# Patient Record
Sex: Male | Born: 1980 | Race: White | Hispanic: No | Marital: Single | Smoking: Current every day smoker
Health system: Southern US, Community
[De-identification: ages and names within clinical notes are randomized; demographics above are authoritative.]

## PROBLEM LIST (undated history)

## (undated) DIAGNOSIS — M797 Fibromyalgia: Secondary | ICD-10-CM

## (undated) DIAGNOSIS — M1990 Unspecified osteoarthritis, unspecified site: Secondary | ICD-10-CM

## (undated) HISTORY — DX: Fibromyalgia: M79.7

## (undated) HISTORY — DX: Unspecified osteoarthritis, unspecified site: M19.90

---

## 2012-06-11 ENCOUNTER — Ambulatory Visit (INDEPENDENT_AMBULATORY_CARE_PROVIDER_SITE_OTHER): Payer: BC Managed Care – PPO | Admitting: Family Medicine

## 2012-06-11 VITALS — BP 115/81 | HR 108 | Temp 98.1°F | Resp 16 | Ht 70.0 in | Wt 192.2 lb

## 2012-06-11 DIAGNOSIS — Z720 Tobacco use: Secondary | ICD-10-CM

## 2012-06-11 DIAGNOSIS — K047 Periapical abscess without sinus: Secondary | ICD-10-CM

## 2012-06-11 DIAGNOSIS — F17200 Nicotine dependence, unspecified, uncomplicated: Secondary | ICD-10-CM

## 2012-06-11 MED ORDER — VARENICLINE TARTRATE 1 MG PO TABS: 1.0000 mg | ORAL_TABLET | Freq: Two times a day (BID) | ORAL | Status: DC

## 2012-06-11 MED ORDER — OXYCODONE-ACETAMINOPHEN 5-325 MG PO TABS: 1.0000 | ORAL_TABLET | Freq: Three times a day (TID) | ORAL | Status: DC | PRN

## 2012-06-11 MED ORDER — VARENICLINE TARTRATE 0.5 MG X 11 & 1 MG X 42 PO MISC: ORAL | Status: DC

## 2012-06-11 MED ORDER — PENICILLIN V POTASSIUM 500 MG PO TABS: 500.0000 mg | ORAL_TABLET | Freq: Four times a day (QID) | ORAL | Status: DC

## 2012-06-11 NOTE — Progress Notes (Signed)
  Subjective:    Patient ID: Jerry Murphy, male    DOB: 10/27/1980, 32 y.o.   MRN: [id number]   Chief Complaint  Patient presents with  . Dental Pain    today    HPI Woke from sleep last night suddenly w/ left tooth pain. Started about 3-4 a.m. He had to go to work but having left cheek throbbing pain and headache.  Did take a few tylenol with the tramadol for fibromyalgia.  Does not have a dentist - but plans to.  No bleeding or drainage, some lead test.   PCP: Goes to [facility] in [place] - but is planning to transition care to [place] since he moved here.  Review of Systems    BP 115/81  Pulse 108  Temp(Src) 98.1 F (36.7 C) (Oral)  Resp 16  Ht 5\' 10"  (1.778 m)  Wt 192 lb 3.2 oz (87.181 kg)  BMI 27.58 kg/m2  SpO2 100% Objective:   Physical Exam        Assessment & Plan:  Dental abscess  Tobacco abuse  Meds ordered this encounter  Medications  . traMADol (ULTRAM) 50 MG tablet    Sig: Take 50 mg by mouth 3 (three) times daily.  . tizanidine (ZANAFLEX) 2 MG capsule    Sig: Take 2 mg by mouth 3 (three) times daily.  . penicillin v potassium (VEETID) 500 MG tablet    Sig: Take 1 tablet (500 mg total) by mouth 4 (four) times daily.    Dispense:  56 tablet    Refill:  0  . oxyCODONE-acetaminophen (ROXICET) 5-325 MG per tablet    Sig: Take 1 tablet by mouth every 8 (eight) hours as needed for pain.    Dispense:  20 tablet    Refill:  0  . varenicline (CHANTIX STARTING MONTH PAK) 0.5 MG X 11 & 1 MG X 42 tablet    Sig: Take one 0.5 mg tablet by mouth once daily for 3 days, then increase to one 0.5 mg tablet twice daily for 4 days, then increase to one 1 mg tablet twice daily.    Dispense:  53 tablet    Refill:  0  . varenicline (CHANTIX CONTINUING MONTH PAK) 1 MG tablet    Sig: Take 1 tablet (1 mg total) by mouth 2 (two) times daily.    Dispense:  60 tablet    Refill:  1

## 2012-06-11 NOTE — Patient Instructions (Addendum)
Dr. Kaylyn Layer on [address] is my denstist. He is excellent.  You can also call [id number] and they will find someone who will be a good fit for you.  Smoking Cessation, Tips for Success YOU CAN QUIT SMOKING If you are ready to quit smoking, congratulations! You have chosen to help yourself be healthier. Cigarettes bring nicotine, tar, carbon monoxide, and other irritants into your body. Your lungs, heart, and blood vessels will be able to work better without these poisons. There are many different ways to quit smoking. Nicotine gum, nicotine patches, a nicotine inhaler, or nicotine nasal spray can help with physical craving. Hypnosis, support groups, and medicines help break the habit of smoking. Here are some tips to help you quit for good.  Throw away all cigarettes.  Clean and remove all ashtrays from your home, work, and car.  On a card, write down your reasons for quitting. Carry the card with you and read it when you get the urge to smoke.  Cleanse your body of nicotine. Drink enough water and fluids to keep your urine clear or pale yellow. Do this after quitting to flush the nicotine from your body.  Learn to predict your moods. Do not let a bad situation be your excuse to have a cigarette. Some situations in your life might tempt you into wanting a cigarette.  Never have "just one" cigarette. It leads to wanting another and another. Remind yourself of your decision to quit.  Change habits associated with smoking. If you smoked while driving or when feeling stressed, try other activities to replace smoking. Stand up when drinking your coffee. Brush your teeth after eating. Sit in a different chair when you read the paper. Avoid alcohol while trying to quit, and try to drink fewer caffeinated beverages. Alcohol and caffeine may urge you to smoke.  Avoid foods and drinks that can trigger a desire to smoke, such as sugary or spicy foods and alcohol.  Ask people who smoke not to  smoke around you.  Have something planned to do right after eating or having a cup of coffee. Take a walk or exercise to perk you up. This will help to keep you from overeating.  Try a relaxation exercise to calm you down and decrease your stress. Remember, you may be tense and nervous for the first 2 weeks after you quit, but this will pass.  Find new activities to keep your hands busy. Play with a pen, coin, or rubber band. Doodle or draw things on paper.  Brush your teeth right after eating. This will help cut down on the craving for the taste of tobacco after meals. You can try mouthwash, too.  Use oral substitutes, such as lemon drops, carrots, a cinnamon stick, or chewing gum, in place of cigarettes. Keep them handy so they are available when you have the urge to smoke.  When you have the urge to smoke, try deep breathing.  Designate your home as a nonsmoking area.  If you are a heavy smoker, ask your caregiver about a prescription for nicotine chewing gum. It can ease your withdrawal from nicotine.  Reward yourself. Set aside the cigarette money you save and buy yourself something nice.  Look for support from others. Join a support group or smoking cessation program. Ask someone at home or at work to help you with your plan to quit smoking.  Always ask yourself, "Do I need this cigarette or is this just a reflex?" Tell yourself, "Today, I  choose not to smoke," or "I do not want to smoke." You are reminding yourself of your decision to quit, even if you do smoke a cigarette. HOW WILL I FEEL WHEN I QUIT SMOKING?  The benefits of not smoking start within days of quitting.  You may have symptoms of withdrawal because your body is used to nicotine (the addictive substance in cigarettes). You may crave cigarettes, be irritable, feel very hungry, cough often, get headaches, or have difficulty concentrating.  The withdrawal symptoms are only temporary. They are strongest when you first  quit but will go away within 10 to 14 days.  When withdrawal symptoms occur, stay in control. Think about your reasons for quitting. Remind yourself that these are signs that your body is healing and getting used to being without cigarettes.  Remember that withdrawal symptoms are easier to treat than the major diseases that smoking can cause.  Even after the withdrawal is over, expect periodic urges to smoke. However, these cravings are generally short-lived and will go away whether you smoke or not. Do not smoke!  If you relapse and smoke again, do not lose hope. Most smokers quit 3 times before they are successful.  If you relapse, do not give up! Plan ahead and think about what you will do the next time you get the urge to smoke. LIFE AS A NONSMOKER: MAKE IT FOR A MONTH, MAKE IT FOR LIFE Day 1: Hang this page where you will see it every day. Day 2: Get rid of all ashtrays, matches, and lighters. Day 3: Drink water. Breathe deeply between sips. Day 4: Avoid places with smoke-filled air, such as bars, clubs, or the smoking section of restaurants. Day 5: Keep track of how much money you save by not smoking. Day 6: Avoid boredom. Keep a good book with you or go to the movies. Day 7: Reward yourself! One week without smoking! Day 8: Make a dental appointment to get your teeth cleaned. Day 9: Decide how you will turn down a cigarette before it is offered to you. Day 10: Review your reasons for quitting. Day 11: Distract yourself. Stay active to keep your mind off smoking and to relieve tension. Take a walk, exercise, read a book, do a crossword puzzle, or try a new hobby. Day 12: Exercise. Get off the bus before your stop or use stairs instead of escalators. Day 13: Call on friends for support and encouragement. Day 14: Reward yourself! Two weeks without smoking! Day 15: Practice deep breathing exercises. Day 16: Bet a friend that you can stay a nonsmoker. Day 17: Ask to sit in nonsmoking  sections of restaurants. Day 18: Hang up "No Smoking" signs. Day 19: Think of yourself as a nonsmoker. Day 20: Each morning, tell yourself you will not smoke. Day 21: Reward yourself! Three weeks without smoking! Day 22: Think of smoking in negative ways. Remember how it stains your teeth, gives you bad breath, and leaves you short of breath. Day 23: Eat a nutritious breakfast. Day 24:Do not relive your days as a smoker. Day 25: Hold a pencil in your hand when talking on the telephone. Day 26: Tell all your friends you do not smoke. Day 27: Think about how much better food tastes. Day 28: Remember, one cigarette is one too many. Day 29: Take up a hobby that will keep your hands busy. Day 30: Congratulations! One month without smoking! Give yourself a big reward. Your caregiver can direct you to community resources or  hospitals for support, which may include:  Group support.  Education.  Hypnosis.  Subliminal therapy. Document Released: 10/18/2003 Document Revised: 04/13/2011 Document Reviewed: 11/05/2008 [facility] Patient Information 2013 [place], [place].

## 2012-06-15 ENCOUNTER — Ambulatory Visit (INDEPENDENT_AMBULATORY_CARE_PROVIDER_SITE_OTHER): Payer: BC Managed Care – PPO | Admitting: Family Medicine

## 2012-06-15 VITALS — BP 120/80 | HR 91 | Temp 97.3°F | Resp 16 | Ht 69.0 in | Wt 191.0 lb

## 2012-06-15 DIAGNOSIS — R197 Diarrhea, unspecified: Secondary | ICD-10-CM

## 2012-06-15 DIAGNOSIS — R109 Unspecified abdominal pain: Secondary | ICD-10-CM

## 2012-06-15 DIAGNOSIS — K5289 Other specified noninfective gastroenteritis and colitis: Secondary | ICD-10-CM

## 2012-06-15 DIAGNOSIS — K529 Noninfective gastroenteritis and colitis, unspecified: Secondary | ICD-10-CM

## 2012-06-15 DIAGNOSIS — R112 Nausea with vomiting, unspecified: Secondary | ICD-10-CM

## 2012-06-15 LAB — COMPREHENSIVE METABOLIC PANEL
ALT: 25 U/L (ref 0–53)
AST: 19 U/L (ref 0–37)
Albumin: 5 g/dL (ref 3.5–5.2)
Calcium: 10.3 mg/dL (ref 8.4–10.5)
Chloride: 104 mEq/L (ref 96–112)
Creat: 0.77 mg/dL (ref 0.50–1.35)
Potassium: 4.6 mEq/L (ref 3.5–5.3)
Sodium: 140 mEq/L (ref 135–145)

## 2012-06-15 LAB — POCT CBC
Granulocyte percent: 66.7 %G (ref 37–80)
MID (cbc): 0.5 (ref 0–0.9)
MPV: 8 fL (ref 0–99.8)
POC Granulocyte: 5.4 (ref 2–6.9)
POC MID %: 5.6 %M (ref 0–12)
Platelet Count, POC: 287 10*3/uL (ref 142–424)
RBC: 5.21 M/uL (ref 4.69–6.13)

## 2012-06-15 LAB — POCT UA - MICROSCOPIC ONLY
Bacteria, U Microscopic: NEGATIVE
Casts, Ur, LPF, POC: NEGATIVE
Crystals, Ur, HPF, POC: NEGATIVE
Mucus, UA: NEGATIVE
Yeast, UA: NEGATIVE

## 2012-06-15 LAB — POCT URINALYSIS DIPSTICK
Protein, UA: NEGATIVE
Spec Grav, UA: 1.015
pH, UA: 8

## 2012-06-15 MED ORDER — PROMETHAZINE HCL 25 MG PO TABS: 25.0000 mg | ORAL_TABLET | Freq: Four times a day (QID) | ORAL | Status: DC | PRN

## 2012-06-15 NOTE — Progress Notes (Signed)
Subjective:    Patient ID: QAADIR KENT, male    DOB: October 03, 1980, 32 y.o.   MRN: [id number] Chief Complaint  Patient presents with  . Abdominal Pain    x 1 day   . Nausea    x 1 day   . Emesis    x 1 day  . Diarrhea    x 1 day     HPI  Mr. Pellerito is a pleasant 32 yo male who I saw several days ago for a dental abscess. He did see the dentist last week and he is to continue the full course of the penicillin and then return to the dentist to have his tooth removed after the infection has resolved - his appt is 5/23 for this and he is supposed to cont the pcn till 5/23.  Has been on pcn many times prev and never had any GI side effects or reactions to the med.  He did not need any of the pain medication, percocet, after about 6-8 pills as the antibiotic had kicked in so has not taken any of this in several days. Has not started the chantix yet. About 3d ago, Mr. Bunton developed large amount of liquid diarrhea. Then yesterday, he became nauseated and started vomiting up all food and liquid he got down. Can't keep anything down. Feeling sick, not sleeping at all due to sxs - waking up every 2-3 hrs w/ f/c/sweats. Diffuse muscle and joint aches, no rash.  Urinating normally but brighter color.  Still having diarrhea. Mother w/ some similar illness.  No recent alcohol use but father died of pancreatic cancer and mother also has pancreas problems. But no personal hx.  History reviewed. No pertinent past surgical history.  Past Medical History  Diagnosis Date  . Anxiety   . Arthritis    Current Outpatient Prescriptions on File Prior to Visit  Medication Sig Dispense Refill  . penicillin v potassium (VEETID) 500 MG tablet Take 1 tablet (500 mg total) by mouth 4 (four) times daily.  56 tablet  0  . tizanidine (ZANAFLEX) 2 MG capsule Take 2 mg by mouth 3 (three) times daily.      . traMADol (ULTRAM) 50 MG tablet Take 50 mg by mouth 3 (three) times daily.      . varenicline  (CHANTIX CONTINUING MONTH PAK) 1 MG tablet Take 1 tablet (1 mg total) by mouth 2 (two) times daily.  60 tablet  1  . varenicline (CHANTIX STARTING MONTH PAK) 0.5 MG X 11 & 1 MG X 42 tablet Take one 0.5 mg tablet by mouth once daily for 3 days, then increase to one 0.5 mg tablet twice daily for 4 days, then increase to one 1 mg tablet twice daily.  53 tablet  0   No current facility-administered medications on file prior to visit.   Allergies  Allergen Reactions  . Nsaids Other (See Comments)   Family History  Problem Relation Age of Onset  . Cancer Mother   . Diabetes Mother   . Diabetes Father   . Cancer Father    History   Social History  . Marital Status: Single    Spouse Name: N/A    Number of Children: N/A  . Years of Education: N/A   Social History Main Topics  . Smoking status: Current Every Day Smoker  . Smokeless tobacco: None  . Alcohol Use: No  . Drug Use: No  . Sexually Active: Yes   Other Topics Concern  .  None   Social History Narrative  . None     Review of Systems  Constitutional: Positive for fever, chills, diaphoresis, activity change, appetite change and fatigue. Negative for unexpected weight change.  HENT: Positive for neck pain and neck stiffness.   Respiratory: Negative for shortness of breath.   Cardiovascular: Negative for chest pain.  Gastrointestinal: Positive for nausea, vomiting, abdominal pain and diarrhea. Negative for constipation, blood in stool, abdominal distention, anal bleeding and rectal pain.  Endocrine: Negative for polydipsia, polyphagia and polyuria.  Genitourinary: Positive for hematuria and flank pain. Negative for dysuria, frequency and decreased urine volume.  Musculoskeletal: Positive for myalgias, back pain and arthralgias. Negative for joint swelling and gait problem.  Skin: Negative for rash.  Allergic/Immunologic: Negative for environmental allergies, food allergies and immunocompromised state.  Neurological:  Positive for weakness, light-headedness and headaches. Negative for syncope, facial asymmetry and numbness.  Hematological: Negative for adenopathy. Does not bruise/bleed easily.  Psychiatric/Behavioral: Positive for sleep disturbance.       BP 120/80  Pulse 91  Temp(Src) 97.3 F (36.3 C) (Oral)  Resp 16  Ht 5\' 9"  (1.753 m)  Wt 191 lb (86.637 kg)  BMI 28.19 kg/m2  SpO2 99% Objective:   Physical Exam  Constitutional: He appears well-developed and well-nourished. No distress.  HENT:  Head: Normocephalic and atraumatic.  Neck: Normal range of motion. Neck supple. No thyromegaly present.  Cardiovascular: Normal rate, regular rhythm and normal heart sounds.   Pulmonary/Chest: Effort normal and breath sounds normal.  Abdominal: Soft. Normal appearance. He exhibits no distension, no pulsatile midline mass and no mass. Bowel sounds are decreased. There is no hepatosplenomegaly. There is generalized tenderness. There is rebound and CVA tenderness. There is no rigidity, no guarding, no tenderness at McBurney's point and negative Murphy's sign. No hernia.  Genitourinary: Rectum normal and prostate normal. Rectal exam shows no tenderness and anal tone normal. Guaiac negative stool.  Lymphadenopathy:    He has no cervical adenopathy.  Skin: He is not diaphoretic.      Results for orders placed in visit on 06/15/12  POCT UA - MICROSCOPIC ONLY      Result Value Range   WBC, Ur, HPF, POC neg     RBC, urine, microscopic neg     Bacteria, U Microscopic neg     Mucus, UA neg     Epithelial cells, urine per micros 1-2     Crystals, Ur, HPF, POC neg     Casts, Ur, LPF, POC neg     Yeast, UA neg     Amorphous positive    POCT URINALYSIS DIPSTICK      Result Value Range   Color, UA yellow     Clarity, UA cloudy     Glucose, UA neg     Bilirubin, UA neg     Ketones, UA trace     Spec Grav, UA 1.015     Blood, UA neg     pH, UA 8.0     Protein, UA neg     Urobilinogen, UA 0.2     Nitrite,  UA neg     Leukocytes, UA Negative    POCT CBC      Result Value Range   WBC 8.1  4.6 - 10.2 K/uL   Lymph, poc 2.2  0.6 - 3.4   POC LYMPH PERCENT 27.7  10 - 50 %L   MID (cbc) 0.5  0 - 0.9   POC MID % 5.6  0 -  12 %M   POC Granulocyte 5.4  2 - 6.9   Granulocyte percent 66.7  37 - 80 %G   RBC 5.21  4.69 - 6.13 M/uL   Hemoglobin 16.1  14.1 - 18.1 g/dL   HCT, POC 04.5  40.9 - 53.7 %   MCV 95.9  80 - 97 fL   MCH, POC 30.9  27 - 31.2 pg   MCHC 32.2  31.8 - 35.4 g/dL   RDW, POC 81.1     Platelet Count, POC 287  142 - 424 K/uL   MPV 8.0  0 - 99.8 fL    Assessment & Plan:  Nausea and vomiting  Abdominal pain, unspecified site - Plan: POCT UA - Microscopic Only, POCT urinalysis dipstick, POCT CBC, Comprehensive metabolic panel, Lipase  Diarrhea  Gastroenteritis - suspect viral gastroenteritis - unfortunately, the penicillin may be making this worse but for now w/ trx sxs w/ prn phenergan.  Still has some oxycodone at home from prior visit - ok to use to trx abd pain and may slow down diarrhea as well. Consider prn otc imodium if doesn't. Push fluids - rec water and gatorade/powerade/pedialyte. If abd pain worsens or localizes, or sxs cont for more than 2-3d, RTC for further eval.  Tobacco abuse - do not start chantix until GI sxs completely resolve and the course of penicillin is complete due to potentially overlapping side effects.  Meds ordered this encounter  Medications  . promethazine (PHENERGAN) 25 MG tablet    Sig: Take 1 tablet (25 mg total) by mouth every 6 (six) hours as needed for nausea.    Dispense:  30 tablet    Refill:  0

## 2012-07-20 ENCOUNTER — Ambulatory Visit (INDEPENDENT_AMBULATORY_CARE_PROVIDER_SITE_OTHER): Payer: BC Managed Care – PPO | Admitting: Family Medicine

## 2012-07-20 VITALS — BP 120/82 | HR 100 | Temp 98.6°F | Resp 16 | Ht 69.5 in | Wt 191.0 lb

## 2012-07-20 DIAGNOSIS — K0889 Other specified disorders of teeth and supporting structures: Secondary | ICD-10-CM

## 2012-07-20 DIAGNOSIS — K047 Periapical abscess without sinus: Secondary | ICD-10-CM

## 2012-07-20 DIAGNOSIS — K044 Acute apical periodontitis of pulpal origin: Secondary | ICD-10-CM

## 2012-07-20 DIAGNOSIS — K089 Disorder of teeth and supporting structures, unspecified: Secondary | ICD-10-CM

## 2012-07-20 DIAGNOSIS — K029 Dental caries, unspecified: Secondary | ICD-10-CM

## 2012-07-20 MED ORDER — HYDROCODONE-ACETAMINOPHEN 5-325 MG PO TABS: 1.0000 | ORAL_TABLET | Freq: Four times a day (QID) | ORAL | Status: DC | PRN

## 2012-07-20 MED ORDER — AMOXICILLIN 500 MG PO CAPS: 1000.0000 mg | ORAL_CAPSULE | Freq: Two times a day (BID) | ORAL | Status: DC

## 2012-07-20 NOTE — Patient Instructions (Addendum)
1.  Very important to keep appointment with dentist in two days. 2. Recommend brushing with Sensodyne toothpaste for sensitive teeth.

## 2012-07-20 NOTE — Progress Notes (Signed)
[address]   [place], [place]  [address]   [phone]  Subjective:    Patient ID: Jerry Murphy, male    DOB: January 17, 1981, 32 y.o.   MRN: [id number]  HPI This 33 y.o. male presents for evaluation of dental pain lower R side.  Evaluated by Dr. Clelia Croft on 06/11/12 for dental pain L upper; rx for Penicillin and Oxycodone provided; advised to establish with dentist.   Evaluated by dentist last month for L upper dental issues; resolved L sided issues.  Now having R lower dental pain.  Pain to eat and brush teeth.  Cold drinks make pain worse.  No fever/chills/sweats.  +mild facial swelling.  Has multiple dental caries. Does not have dental coverage.     Review of Systems  Constitutional: Negative for fever, chills, diaphoresis and fatigue.  HENT: Positive for facial swelling and dental problem. Negative for ear pain and sinus pressure.   Skin: Negative for wound.    Past Medical History  Diagnosis Date  . Arthritis   . Fibromyalgia     Rheumatologist/[place].    History reviewed. No pertinent past surgical history.  Prior to Admission medications   Medication Sig Start Date End Date Taking? Authorizing Provider  tizanidine (ZANAFLEX) 2 MG capsule Take 2 mg by mouth 3 (three) times daily.   Yes Historical Provider, MD  traMADol (ULTRAM) 50 MG tablet Take 50 mg by mouth 3 (three) times daily.   Yes Historical Provider, MD  amoxicillin (AMOXIL) 500 MG capsule Take 2 capsules (1,000 mg total) by mouth 2 (two) times daily. 07/20/12   Ethelda Chick, MD  HYDROcodone-acetaminophen (NORCO/VICODIN) 5-325 MG per tablet Take 1 tablet by mouth every 6 (six) hours as needed for pain. 07/20/12   Ethelda Chick, MD  penicillin v potassium (VEETID) 500 MG tablet Take 1 tablet (500 mg total) by mouth 4 (four) times daily. 06/11/12   Sherren Mocha, MD  promethazine (PHENERGAN) 25 MG tablet Take 1 tablet (25 mg total) by mouth every 6 (six) hours as needed for nausea. 06/15/12   Sherren Mocha, MD    varenicline (CHANTIX CONTINUING MONTH PAK) 1 MG tablet Take 1 tablet (1 mg total) by mouth 2 (two) times daily. 06/11/12   Sherren Mocha, MD  varenicline (CHANTIX STARTING MONTH PAK) 0.5 MG X 11 & 1 MG X 42 tablet Take one 0.5 mg tablet by mouth once daily for 3 days, then increase to one 0.5 mg tablet twice daily for 4 days, then increase to one 1 mg tablet twice daily. 06/11/12   Sherren Mocha, MD    Allergies  Allergen Reactions  . Nsaids Other (See Comments)    History   Social History  . Marital Status: Single    Spouse Name: N/A    Number of Children: N/A  . Years of Education: N/A   Occupational History  . Not on file.   Social History Main Topics  . Smoking status: Current Every Day Smoker  . Smokeless tobacco: Not on file  . Alcohol Use: No  . Drug Use: No  . Sexually Active: Yes   Other Topics Concern  . Not on file   Social History Narrative   Marital status:  Single      Children: none      Lives: alone      Employment: owns [employer] in [place].      Tobacco: 1/2 ppd      Alcohol:  None  Drugs: none    Family History  Problem Relation Age of Onset  . Cancer Mother     pancreatic cancer  . Diabetes Mother   . Diabetes Father   . Cancer Father     pancreatic cancer       Objective:   Physical Exam  Nursing note and vitals reviewed. Constitutional: He is oriented to person, place, and time. He appears well-developed and well-nourished. No distress.  HENT:  Head: Normocephalic and atraumatic.  Mouth/Throat: No oral lesions. Dental caries present. No dental abscesses.  +TTP along gums L lower region; mild swelling lower gumline.  No abscess.  Multiple dental caries throughout.  Eyes: Conjunctivae are normal. Pupils are equal, round, and reactive to light.  Neck: Normal range of motion. Neck supple.  Cardiovascular: Normal rate and regular rhythm.   Pulmonary/Chest: Effort normal and breath sounds normal.  Lymphadenopathy:    He has cervical  adenopathy.  Neurological: He is alert and oriented to person, place, and time.  Skin: He is not diaphoretic.  Psychiatric: He has a normal mood and affect. His behavior is normal.       Assessment & Plan:   Pain, dental - Plan: HYDROcodone-acetaminophen (NORCO/VICODIN) 5-325 MG per tablet  Dental infection - Plan: amoxicillin (AMOXIL) 500 MG capsule  Dental caries   1. Dental pain:  New.  Rx for Hydrocodone provided to use when not working or driving.  Follow-up with dentist in 48 hours. 2.  Dental infection R lower:  New.  Rx for Amoxicillin provided.  Keep appointment with dentist in 48 hours. 3.  Multiple dental caries: recommend close follow-up with dentist.  Meds ordered this encounter  Medications  . amoxicillin (AMOXIL) 500 MG capsule    Sig: Take 2 capsules (1,000 mg total) by mouth 2 (two) times daily.    Dispense:  40 capsule    Refill:  0  . HYDROcodone-acetaminophen (NORCO/VICODIN) 5-325 MG per tablet    Sig: Take 1 tablet by mouth every 6 (six) hours as needed for pain.    Dispense:  30 tablet    Refill:  0

## 2012-09-20 ENCOUNTER — Ambulatory Visit (INDEPENDENT_AMBULATORY_CARE_PROVIDER_SITE_OTHER): Payer: BC Managed Care – PPO | Admitting: Family Medicine

## 2012-09-20 VITALS — BP 110/82 | HR 89 | Temp 98.0°F | Resp 18 | Ht 70.0 in | Wt 193.0 lb

## 2012-09-20 DIAGNOSIS — R110 Nausea: Secondary | ICD-10-CM

## 2012-09-20 DIAGNOSIS — M549 Dorsalgia, unspecified: Secondary | ICD-10-CM

## 2012-09-20 MED ORDER — ONDANSETRON 8 MG PO TBDP: 8.0000 mg | ORAL_TABLET | Freq: Three times a day (TID) | ORAL | Status: DC | PRN

## 2012-09-20 NOTE — Patient Instructions (Addendum)
Back Pain, Adult Low back pain is very common. About 1 in 5 people have back pain.The cause of low back pain is rarely dangerous. The pain often gets better over time.About half of people with a sudden onset of back pain feel better in just 2 weeks. About 8 in 10 people feel better by 6 weeks.  CAUSES Some common causes of back pain include:  Strain of the muscles or ligaments supporting the spine.  Wear and tear (degeneration) of the spinal discs.  Arthritis.  Direct injury to the back. DIAGNOSIS Most of the time, the direct cause of low back pain is not known.However, back pain can be treated effectively even when the exact cause of the pain is unknown.Answering your caregiver's questions about your overall health and symptoms is one of the most accurate ways to make sure the cause of your pain is not dangerous. If your caregiver needs more information, he or she may order lab work or imaging tests (X-rays or MRIs).However, even if imaging tests show changes in your back, this usually does not require surgery. HOME CARE INSTRUCTIONS For many people, back pain returns.Since low back pain is rarely dangerous, it is often a condition that people can learn to manageon their own.   Remain active. It is stressful on the back to sit or stand in one place. Do not sit, drive, or stand in one place for more than 30 minutes at a time. Take short walks on level surfaces as soon as pain allows.Try to increase the length of time you walk each day.  Do not stay in bed.Resting more than 1 or 2 days can delay your recovery.  Do not avoid exercise or work.Your body is made to move.It is not dangerous to be active, even though your back may hurt.Your back will likely heal faster if you return to being active before your pain is gone.  Pay attention to your body when you bend and lift. Many people have less discomfortwhen lifting if they bend their knees, keep the load close to their bodies,and  avoid twisting. Often, the most comfortable positions are those that put less stress on your recovering back.  Find a comfortable position to sleep. Use a firm mattress and lie on your side with your knees slightly bent. If you lie on your back, put a pillow under your knees.  Only take over-the-counter or prescription medicines as directed by your caregiver. Over-the-counter medicines to reduce pain and inflammation are often the most helpful.Your caregiver may prescribe muscle relaxant drugs.These medicines help dull your pain so you can more quickly return to your normal activities and healthy exercise.  Put ice on the injured area.  Put ice in a plastic bag.  Place a towel between your skin and the bag.  Leave the ice on for 15-20 minutes, 3-4 times a day for the first 2 to 3 days. After that, ice and heat may be alternated to reduce pain and spasms.  Ask your caregiver about trying back exercises and gentle massage. This may be of some benefit.  Avoid feeling anxious or stressed.Stress increases muscle tension and can worsen back pain.It is important to recognize when you are anxious or stressed and learn ways to manage it.Exercise is a great option. SEEK MEDICAL CARE IF:  You have pain that is not relieved with rest or medicine.  You have pain that does not improve in 1 week.  You have new symptoms.  You are generally not feeling well. SEEK   IMMEDIATE MEDICAL CARE IF:   You have pain that radiates from your back into your legs.  You develop new bowel or bladder control problems.  You have unusual weakness or numbness in your arms or legs.  You develop nausea or vomiting.  You develop abdominal pain.  You feel faint. Document Released: 01/19/2005 Document Revised: 07/21/2011 Document Reviewed: 06/09/2010 [facility] Patient Information 2014 [place], [place]. Nausea and Vomiting Nausea is a sick feeling that often comes before throwing up (vomiting). Vomiting is a reflex  where stomach contents come out of your mouth. Vomiting can cause severe loss of body fluids (dehydration). Children and elderly adults can become dehydrated quickly, especially if they also have diarrhea. Nausea and vomiting are symptoms of a condition or disease. It is important to find the cause of your symptoms. CAUSES   Direct irritation of the stomach lining. This irritation can result from increased acid production (gastroesophageal reflux disease), infection, food poisoning, taking certain medicines (such as nonsteroidal anti-inflammatory drugs), alcohol use, or tobacco use.  Signals from the brain.These signals could be caused by a headache, heat exposure, an inner ear disturbance, increased pressure in the brain from injury, infection, a tumor, or a concussion, pain, emotional stimulus, or metabolic problems.  An obstruction in the gastrointestinal tract (bowel obstruction).  Illnesses such as diabetes, hepatitis, gallbladder problems, appendicitis, kidney problems, cancer, sepsis, atypical symptoms of a heart attack, or eating disorders.  Medical treatments such as chemotherapy and radiation.  Receiving medicine that makes you sleep (general anesthetic) during surgery. DIAGNOSIS Your caregiver may ask for tests to be done if the problems do not improve after a few days. Tests may also be done if symptoms are severe or if the reason for the nausea and vomiting is not clear. Tests may include:  Urine tests.  Blood tests.  Stool tests.  Cultures (to look for evidence of infection).  X-rays or other imaging studies. Test results can help your caregiver make decisions about treatment or the need for additional tests. TREATMENT You need to stay well hydrated. Drink frequently but in small amounts.You may wish to drink water, sports drinks, clear broth, or eat frozen ice pops or gelatin dessert to help stay hydrated.When you eat, eating slowly may help prevent nausea.There are  also some antinausea medicines that may help prevent nausea. HOME CARE INSTRUCTIONS   Take all medicine as directed by your caregiver.  If you do not have an appetite, do not force yourself to eat. However, you must continue to drink fluids.  If you have an appetite, eat a normal diet unless your caregiver tells you differently.  Eat a variety of complex carbohydrates (rice, wheat, potatoes, bread), lean meats, yogurt, fruits, and vegetables.  Avoid high-fat foods because they are more difficult to digest.  Drink enough water and fluids to keep your urine clear or pale yellow.  If you are dehydrated, ask your caregiver for specific rehydration instructions. Signs of dehydration may include:  Severe thirst.  Dry lips and mouth.  Dizziness.  Dark urine.  Decreasing urine frequency and amount.  Confusion.  Rapid breathing or pulse. SEEK IMMEDIATE MEDICAL CARE IF:   You have blood or brown flecks (like coffee grounds) in your vomit.  You have black or bloody stools.  You have a severe headache or stiff neck.  You are confused.  You have severe abdominal pain.  You have chest pain or trouble breathing.  You do not urinate at least once every 8 hours.  You develop  cold or clammy skin.  You continue to vomit for longer than 24 to 48 hours.  You have a fever. MAKE SURE YOU:   Understand these instructions.  Will watch your condition.  Will get help right away if you are not doing well or get worse. Document Released: 01/19/2005 Document Revised: 04/13/2011 Document Reviewed: 06/18/2010 [facility] Patient Information 2014 [place], [place].

## 2012-09-20 NOTE — Progress Notes (Signed)
  Subjective:    Patient ID: Jerry Murphy, male    DOB: [birth year], 31 y.o.   MRN: [id number]  HPI Patient presents with complaint of 2 days of back pain onset after riding roller coaster.  He has not received relief with his PO pain medications (tramadol and zanaflex).  Also complaints of mild nausea from the pain.  No weakness or numbness. No radiation of pain. States he took his last left over hydrocodone with mild relief.   He has history of fibromyalgia and was also seen here for dental pain twice over last 3 months.    Past Medical History  Diagnosis Date  . Arthritis   . Fibromyalgia     Rheumatologist/[place].   History reviewed. No pertinent past surgical history.  Allergies  Allergen Reactions  . Nsaids Other (See Comments)    Review of Systems  Constitutional: Negative for fever, chills and activity change.  HENT: Negative for dental problem.   Gastrointestinal: Positive for nausea and vomiting. Negative for abdominal pain.  Genitourinary: Negative for difficulty urinating.       No incontinence   Musculoskeletal: Positive for back pain. Negative for myalgias and arthralgias.  Neurological: Negative for weakness and numbness.       Objective:   Physical Exam Blood pressure 110/82, pulse 89, temperature 98 F (36.7 C), temperature source Oral, resp. rate 18, height 5\' 10"  (1.778 m), weight 193 lb (87.544 kg), SpO2 99.00%. Body mass index is 27.69 kg/(m^2). Well-developed, well nourished male who is awake, alert and oriented, in NAD. HEENT: [place]/AT, PERRL, EOMI.  OP is clear. Neck: supple, non-tender, no lymphadenopathy, thyromegaly. Heart: RRR, no murmur Lungs: normal effort, CTA Abdomen: normo-active bowel sounds, supple, non-tender, no mass or organomegaly. Extremities: no cyanosis, clubbing or edema. Skin: warm and dry without rash. Psychologic: good mood and appropriate affect, normal speech and behavior. Neuro:  5/5 strength bilateral upper and lower  extremities.  No weakness or numbness.  Negative straight leg raise.  DTRs 2 plus and equal bilaterally. BACK:  Mild paraspinous lumbar pain on exam, no palpable spasm noted.      Assessment & Plan:  Musculoskeletal back pain associated with nausea.  After reviewing records I advised patient that I was not comfortable prescribing narcotic pain medication given his two recent prior visits of pain related complaints and failure to arrange PCP follow up.  He requested phenergan only at that time.  I advised his I would provide a prescription for zofran ODT to take for nausea as needed and that he should continue taking his ultram and zanaflex as needed.  Patient amenable to this situation.

## 2012-09-22 NOTE — Progress Notes (Signed)
Patient discussed with Dr. Lorri Frederick, and CSRS database reviewed. Agree with assessment and plan of care per his note.

## 2013-07-11 ENCOUNTER — Encounter (HOSPITAL_COMMUNITY): Payer: Self-pay | Admitting: Emergency Medicine

## 2013-07-11 ENCOUNTER — Emergency Department (HOSPITAL_COMMUNITY)
Admission: EM | Admit: 2013-07-11 | Discharge: 2013-07-11 | Disposition: A | Discharge status: Home / Self Care | Payer: Self-pay | Attending: Emergency Medicine | Admitting: Emergency Medicine

## 2013-07-11 DIAGNOSIS — IMO0001 Reserved for inherently not codable concepts without codable children: Secondary | ICD-10-CM | POA: Insufficient documentation

## 2013-07-11 DIAGNOSIS — F17200 Nicotine dependence, unspecified, uncomplicated: Secondary | ICD-10-CM | POA: Insufficient documentation

## 2013-07-11 DIAGNOSIS — K047 Periapical abscess without sinus: Secondary | ICD-10-CM | POA: Insufficient documentation

## 2013-07-11 DIAGNOSIS — M129 Arthropathy, unspecified: Secondary | ICD-10-CM | POA: Insufficient documentation

## 2013-07-11 DIAGNOSIS — Z79899 Other long term (current) drug therapy: Secondary | ICD-10-CM | POA: Insufficient documentation

## 2013-07-11 DIAGNOSIS — Z792 Long term (current) use of antibiotics: Secondary | ICD-10-CM | POA: Insufficient documentation

## 2013-07-11 MED ORDER — PENICILLIN V POTASSIUM 500 MG PO TABS: 500.0000 mg | ORAL_TABLET | Freq: Three times a day (TID) | ORAL | Status: DC

## 2013-07-11 MED ORDER — HYDROCODONE-ACETAMINOPHEN 5-325 MG PO TABS: 1.0000 | ORAL_TABLET | ORAL | Status: DC | PRN

## 2013-07-11 NOTE — ED Provider Notes (Signed)
CSN: [id number]     Arrival date & time 07/11/13  1401 History  This chart was scribed for non-physician practitioner, Elpidio Anis, PA-C working with Suzi Roots, MD by Greggory Stallion, ED scribe. This patient was seen in room TR05C/TR05C and the patient's care was started at 3:12 PM.   Chief Complaint  Patient presents with  . Dental Pain   The history is provided by the patient. No language interpreter was used.   HPI Comments: Jerry Murphy is a 33 y.o. male who presents to the Emergency Department complaining of gradual onset left lower dental pain that started earlier this morning. States pain is causing a headache. He thinks he broke his tooth yesterday while he was eating some chicken. Pt has taken tylenol with no relief. Denies fever, facial swelling, trouble swallowing, emesis. Pt smokes cigarettes daily.   Past Medical History  Diagnosis Date  . Arthritis   . Fibromyalgia     Rheumatologist/[place].   History reviewed. No pertinent past surgical history. Family History  Problem Relation Age of Onset  . Cancer Mother     pancreatic cancer  . Diabetes Mother   . Diabetes Father   . Cancer Father     pancreatic cancer   History  Substance Use Topics  . Smoking status: Current Every Day Smoker  . Smokeless tobacco: Not on file  . Alcohol Use: No    Review of Systems  HENT: Positive for dental problem. Negative for trouble swallowing.   Gastrointestinal: Negative for vomiting.  Neurological: Positive for headaches.  All other systems reviewed and are negative.  Allergies  Nsaids  Home Medications   Prior to Admission medications   Medication Sig Start Date End Date Taking? Authorizing Provider  amoxicillin (AMOXIL) 500 MG capsule Take 2 capsules (1,000 mg total) by mouth 2 (two) times daily. 07/20/12   Ethelda Chick, MD  HYDROcodone-acetaminophen (NORCO/VICODIN) 5-325 MG per tablet Take 1 tablet by mouth every 6 (six) hours as needed for pain. 07/20/12    Ethelda Chick, MD  ondansetron (ZOFRAN-ODT) 8 MG disintegrating tablet Take 1 tablet (8 mg total) by mouth every 8 (eight) hours as needed for nausea. 09/20/12   Tarry Kos, MD  penicillin v potassium (VEETID) 500 MG tablet Take 1 tablet (500 mg total) by mouth 4 (four) times daily. 06/11/12   Sherren Mocha, MD  promethazine (PHENERGAN) 25 MG tablet Take 1 tablet (25 mg total) by mouth every 6 (six) hours as needed for nausea. 06/15/12   Sherren Mocha, MD  tizanidine (ZANAFLEX) 2 MG capsule Take 2 mg by mouth 3 (three) times daily.    Historical Provider, MD  traMADol (ULTRAM) 50 MG tablet Take 50 mg by mouth 3 (three) times daily.    Historical Provider, MD  varenicline (CHANTIX CONTINUING MONTH PAK) 1 MG tablet Take 1 tablet (1 mg total) by mouth 2 (two) times daily. 06/11/12   Sherren Mocha, MD  varenicline (CHANTIX STARTING MONTH PAK) 0.5 MG X 11 & 1 MG X 42 tablet Take one 0.5 mg tablet by mouth once daily for 3 days, then increase to one 0.5 mg tablet twice daily for 4 days, then increase to one 1 mg tablet twice daily. 06/11/12   Sherren Mocha, MD   BP 123/70  Pulse 79  Temp(Src) 97.5 F (36.4 C)  Resp 18  Ht 5\' 9"  (1.753 m)  Wt 180 lb (81.647 kg)  BMI 26.57 kg/m2  SpO2 100%  Physical Exam  Nursing note and vitals reviewed. Constitutional: He is oriented to person, place, and time. He appears well-developed and well-nourished. No distress.  HENT:  Head: Normocephalic and atraumatic.  Significant swelling to tooth #17 as well as #16, consistent with abscess. No facial swelling.   Eyes: Conjunctivae and EOM are normal.  Neck: Normal range of motion. No tracheal deviation present.  Cardiovascular: Normal rate.   Pulmonary/Chest: Effort normal. No respiratory distress.  Musculoskeletal: Normal range of motion.  Lymphadenopathy:    He has no cervical adenopathy.  No lymphadenopathy.   Neurological: He is alert and oriented to person, place, and time.  Skin: Skin is warm and dry.   Psychiatric: He has a normal mood and affect. His behavior is normal.    ED Course  Procedures (including critical care time)  DIAGNOSTIC STUDIES: Oxygen Saturation is 100% on RA, normal by my interpretation.    COORDINATION OF CARE: 3:13 PM-Discussed treatment plan which includes penicillin and pain medication with pt at bedside and pt agreed to plan. Will give pt dental referrals and advised him to follow up.   Labs Review Labs Reviewed - No data to display  Imaging Review No results found.   EKG Interpretation None      MDM   Final diagnoses:  None    1. Dental abscess  Uncomplicated dental infection.  I personally performed the services described in this documentation, which was scribed in my presence. The recorded information has been reviewed and is accurate.  Arnoldo HookerShari A Itzael Liptak, PA-C 07/11/13 1532

## 2013-07-11 NOTE — Discharge Instructions (Signed)
Abscessed Tooth  An abscessed tooth is an infection around your tooth. It may be caused by holes or damage to the tooth (cavity) or a dental disease. An abscessed tooth causes mild to very bad pain in and around the tooth. See your dentist right away if you have tooth or gum pain.  HOME CARE   Take your medicine as told. Finish it even if you start to feel better.   Do not drive after taking pain medicine.   Rinse your mouth (gargle) often with salt water ( teaspoon salt in 8 ounces of warm water).   Do not apply heat to the outside of your face.  GET HELP RIGHT AWAY IF:    You have a temperature by mouth above 102 F (38.9 C), not controlled by medicine.   You have chills and a very bad headache.   You have problems breathing or swallowing.   Your mouth will not open.   You develop puffiness (swelling) on the neck or around the eye.   Your pain is not helped by medicine.   Your pain is getting worse instead of better.  MAKE SURE YOU:    Understand these instructions.   Will watch your condition.   Will get help right away if you are not doing well or get worse.  Document Released: 07/08/2007 Document Revised: 04/13/2011 Document Reviewed: 04/29/2010  [employer] Patient Information 2014 [employer].

## 2013-07-11 NOTE — ED Notes (Signed)
Pt reports left lower back molar dental pain since early this am. States he ate some chicken yesterday and may have broken the tooth.

## 2013-07-12 NOTE — ED Provider Notes (Signed)
Medical screening examination/treatment/procedure(s) were performed by non-physician practitioner and as supervising physician I was immediately available for consultation/collaboration.     Suzi Roots, MD 07/12/13 [phone]

## 2013-08-03 ENCOUNTER — Encounter (HOSPITAL_COMMUNITY): Payer: Self-pay | Admitting: Emergency Medicine

## 2013-08-03 ENCOUNTER — Emergency Department (HOSPITAL_COMMUNITY)
Admission: EM | Admit: 2013-08-03 | Discharge: 2013-08-03 | Disposition: A | Discharge status: Home / Self Care | Payer: Self-pay | Attending: Emergency Medicine | Admitting: Emergency Medicine

## 2013-08-03 DIAGNOSIS — K089 Disorder of teeth and supporting structures, unspecified: Secondary | ICD-10-CM | POA: Insufficient documentation

## 2013-08-03 DIAGNOSIS — K0889 Other specified disorders of teeth and supporting structures: Secondary | ICD-10-CM

## 2013-08-03 DIAGNOSIS — F17200 Nicotine dependence, unspecified, uncomplicated: Secondary | ICD-10-CM | POA: Insufficient documentation

## 2013-08-03 DIAGNOSIS — IMO0001 Reserved for inherently not codable concepts without codable children: Secondary | ICD-10-CM | POA: Insufficient documentation

## 2013-08-03 DIAGNOSIS — Z79899 Other long term (current) drug therapy: Secondary | ICD-10-CM | POA: Insufficient documentation

## 2013-08-03 DIAGNOSIS — Z8739 Personal history of other diseases of the musculoskeletal system and connective tissue: Secondary | ICD-10-CM | POA: Insufficient documentation

## 2013-08-03 DIAGNOSIS — R51 Headache: Secondary | ICD-10-CM | POA: Insufficient documentation

## 2013-08-03 DIAGNOSIS — Z792 Long term (current) use of antibiotics: Secondary | ICD-10-CM | POA: Insufficient documentation

## 2013-08-03 MED ORDER — PENICILLIN V POTASSIUM 500 MG PO TABS: 500.0000 mg | ORAL_TABLET | Freq: Four times a day (QID) | ORAL | Status: AC

## 2013-08-03 MED ORDER — HYDROCODONE-ACETAMINOPHEN 5-325 MG PO TABS: 1.0000 | ORAL_TABLET | Freq: Four times a day (QID) | ORAL | Status: DC | PRN

## 2013-08-03 NOTE — Discharge Instructions (Signed)
You have a dental injury. Use the resource guide listed below to help you find a dentist if you do not already have one to followup with. It is very important that you get evaluated by a dentist as soon as possible. Call tomorrow to schedule an appointment. Use your pain medication as prescribed and do not operate heavy machinery while on pain medication. Note that your pain medication contains acetaminophen (Tylenol) & its is not recommended that you use additional acetaminophen (Tylenol) while taking this medication. Take your full course of antibiotics. Read the instructions below. ° °Eat a soft or liquid diet and rinse your mouth out after meals with warm water. You should see a dentist or return here at once if you have increased swelling, increased pain or uncontrolled bleeding from the site of your injury. ° ° °SEEK MEDICAL CARE IF:  °· You have increased pain not controlled with medicines.  °· You have swelling around your tooth, in your face or neck.  °· You have bleeding which starts, continues, or gets worse.  °· You have a fever >101 °· If you are unable to open your mouth ° °RESOURCE GUIDE ° °Dental Problems ° °Patients with Medicaid: °[facility]                     [facility] °[address]                                           [address] °Phone:  [phone]                                                  Phone:  510-2600 ° °If unable to pay or uninsured, contact:  [facility] or [facility] to become qualified for the adult dental clinic. ° °Chronic Pain Problems °Contact [facility]  297-2271 °Patients need to be referred by their primary care doctor. ° °Insufficient Money for Medicine °Contact [employer]:  call "211" or [facility] 271-5999. ° °No Primary Care Doctor °Call [facility]  832-8000 °Other agencies that provide inexpensive medical care °   [facility]  832-8035 °   [facility]  832-7272 °   [facility]  271-5999 °   [facility]  832-4777 °   [facility]  373-0678 °   [facility]  272-1050 ° °Psychological Services °[facility]  832-9600 °[facility]  378-7881 °[facility]   800 853-5163 (emergency services 641-4993) ° °Substance Abuse Resources °[facility]  336-882-2125 °[facility] 336-784-9470 °[facility] 336-285-9073 °[facility] 336-845-3988 °Residential & Outpatient Substance Abuse Program  800-659-3381 ° °Abuse/Neglect °[place] [facility] (336) 641-3795 °[place] [facility] 800-378-5315 (After Hours) ° °Emergency Shelter °[facility] (336) 271-5985 ° °Maternity Homes °[facility] (336) 275-9566 °[facility] (704) 372-4663 ° °MRSA Hotline #:   832-7006 ° ° ° °[place] [facility] ° °[facility]     [employer]                            [facility] °[address] [place]                       [address]      [address]  °[place]                                                [place]                            [place] °Phone:  349-3220                                   Phone:  342-7768                 Phone:  342-8140 ° °[facility] °Phone:  342-8316 ° °[place] [facility] Hotline °(336) 342-1394 °(336) 342-3537 (After Hours) ° ° ° ° °

## 2013-08-03 NOTE — ED Provider Notes (Signed)
CSN: [id number]     Arrival date & time 08/03/13  1437 History  This chart was scribed for Santiago GladHeather Shameika Speelman PA-C working with Gwyneth SproutWhitney Plunkett, MD by Ashley JacobsBrittany Andrews, ED scribe. This patient was seen in room WTR6/WTR6 and the patient's care was started at 3:16 PM.  First MD Initiated Contact with Patient 08/03/13 1452     Chief Complaint  Patient presents with  . Dental Pain     (Consider location/radiation/quality/duration/timing/severity/associated sxs/prior Treatment) The history is provided by the patient and medical records. No language interpreter was used.   HPI Comments: Jerry Murphy is a 33 y.o. male who presents to the Emergency Department complaining of constant, throbbing left lower dental pain since last night. Pt mentions having pain with drinking last night. The pain wakes him out of his sleep. He had a prior dental abscess and mentions this pain feels similar. He has a temperature of 99 in ED today. Denies fever. Denies nausea or vomiting.  No facial swelling.  He has taken OTC pain medications without relief.  He reports that he tried calling his dentist today to set up an appointment, but the dentist office was not open.   [place]     Past Medical History  Diagnosis Date  . Arthritis   . Fibromyalgia     Rheumatologist/[place].   History reviewed. No pertinent past surgical history. Family History  Problem Relation Age of Onset  . Cancer Mother     pancreatic cancer  . Diabetes Mother   . Diabetes Father   . Cancer Father     pancreatic cancer   History  Substance Use Topics  . Smoking status: Current Every Day Smoker  . Smokeless tobacco: Not on file  . Alcohol Use: No    Review of Systems  Constitutional: Negative for fever and chills.  HENT: Positive for dental problem.   Gastrointestinal: Negative for nausea and vomiting.  Neurological: Positive for headaches.  All other systems reviewed and are  negative.     Allergies  Nsaids  Home Medications   Prior to Admission medications   Medication Sig Start Date End Date Taking? Authorizing Provider  amoxicillin (AMOXIL) 500 MG capsule Take 2 capsules (1,000 mg total) by mouth 2 (two) times daily. 07/20/12   Ethelda ChickKristi M Smith, MD  HYDROcodone-acetaminophen (NORCO/VICODIN) 5-325 MG per tablet Take 1 tablet by mouth every 6 (six) hours as needed for pain. 07/20/12   Ethelda ChickKristi M Smith, MD  HYDROcodone-acetaminophen (NORCO/VICODIN) 5-325 MG per tablet Take 1-2 tablets by mouth every 4 (four) hours as needed. 07/11/13   Shari A Upstill, PA-C  ondansetron (ZOFRAN-ODT) 8 MG disintegrating tablet Take 1 tablet (8 mg total) by mouth every 8 (eight) hours as needed for nausea. 09/20/12   Tarry Kosodd M McGrath, MD  penicillin v potassium (VEETID) 500 MG tablet Take 1 tablet (500 mg total) by mouth 4 (four) times daily. 06/11/12   Sherren MochaEva N Shaw, MD  penicillin v potassium (VEETID) 500 MG tablet Take 1 tablet (500 mg total) by mouth 3 (three) times daily. 07/11/13   Shari A Upstill, PA-C  promethazine (PHENERGAN) 25 MG tablet Take 1 tablet (25 mg total) by mouth every 6 (six) hours as needed for nausea. 06/15/12   Sherren MochaEva N Shaw, MD  tizanidine (ZANAFLEX) 2 MG capsule Take 2 mg by mouth 3 (three) times daily.    Historical Provider, MD  traMADol (ULTRAM) 50 MG tablet Take 50 mg by mouth 3 (  three) times daily.    Historical Provider, MD  varenicline (CHANTIX CONTINUING MONTH PAK) 1 MG tablet Take 1 tablet (1 mg total) by mouth 2 (two) times daily. 06/11/12   Sherren MochaEva N Shaw, MD  varenicline (CHANTIX STARTING MONTH PAK) 0.5 MG X 11 & 1 MG X 42 tablet Take one 0.5 mg tablet by mouth once daily for 3 days, then increase to one 0.5 mg tablet twice daily for 4 days, then increase to one 1 mg tablet twice daily. 06/11/12   Sherren MochaEva N Shaw, MD   BP 140/97  Pulse 79  Temp(Src) 99.8 F (37.7 C) (Oral)  Resp 16  SpO2 99% Physical Exam  Nursing note and vitals reviewed. Constitutional: He is  oriented to person, place, and time. He appears well-developed and well-nourished.  HENT:  Head: Normocephalic and atraumatic.  Mouth/Throat: Oropharynx is clear and moist.  No facial swelling.  Tenderness to palpation of the left lower ginviva. No obvious abcess present. No sublingual tenderness or swelling. No submandibular or submental swelling or lymphadenopathy. No trismus. Pt handling secretions well, no drooling.   Eyes: Pupils are equal, round, and reactive to light.  Neck: Normal range of motion.  Full ROM of the neck  Cardiovascular: Normal rate, regular rhythm and normal heart sounds.   Pulmonary/Chest: Effort normal and breath sounds normal.  Lymphadenopathy:    He has no cervical adenopathy.  Neurological: He is alert and oriented to person, place, and time.  Skin: Skin is warm and dry. He is not diaphoretic.    ED Course  Procedures (including critical care time) DIAGNOSTIC STUDIES: Oxygen Saturation is 99% on room air, normal by my interpretation.    COORDINATION OF CARE:  3:19 PM Discussed course of care with pt . Pt understands and agrees.   Labs Review Labs Reviewed - No data to display  Imaging Review No results found.   EKG Interpretation None      MDM   Final diagnoses:  None   Patient with toothache.  No gross abscess.  Exam unconcerning for Ludwig's angina or spread of infection.  Will treat with penicillin and pain medicine.  Urged patient to follow-up with dentist.    I personally performed the services described in this documentation, which was scribed in my presence. The recorded information has been reviewed and is accurate.    Santiago GladHeather Delyla Sandeen, PA-C 08/03/13 1751

## 2013-08-03 NOTE — ED Notes (Signed)
Pt noticed dental pain coming on last night when he drank a soft drink then the dental pain woke him up during the night. Pt states that he has had dental abscess in past.

## 2013-08-04 NOTE — ED Provider Notes (Signed)
Medical screening examination/treatment/procedure(s) were performed by non-physician practitioner and as supervising physician I was immediately available for consultation/collaboration.   EKG Interpretation None        Jerry SproutWhitney Ranald Alessio, MD 08/04/13 [phone]

## 2014-01-23 ENCOUNTER — Emergency Department (HOSPITAL_COMMUNITY): Payer: BC Managed Care – PPO

## 2014-01-23 ENCOUNTER — Emergency Department (HOSPITAL_COMMUNITY)
Admission: EM | Admit: 2014-01-23 | Discharge: 2014-01-23 | Disposition: A | Discharge status: Home / Self Care | Payer: BC Managed Care – PPO | Attending: Emergency Medicine | Admitting: Emergency Medicine

## 2014-01-23 ENCOUNTER — Encounter (HOSPITAL_COMMUNITY): Payer: Self-pay | Admitting: *Deleted

## 2014-01-23 DIAGNOSIS — Z792 Long term (current) use of antibiotics: Secondary | ICD-10-CM | POA: Diagnosis not present

## 2014-01-23 DIAGNOSIS — Z720 Tobacco use: Secondary | ICD-10-CM | POA: Insufficient documentation

## 2014-01-23 DIAGNOSIS — Z79899 Other long term (current) drug therapy: Secondary | ICD-10-CM | POA: Diagnosis not present

## 2014-01-23 DIAGNOSIS — R4182 Altered mental status, unspecified: Secondary | ICD-10-CM | POA: Insufficient documentation

## 2014-01-23 DIAGNOSIS — M797 Fibromyalgia: Secondary | ICD-10-CM | POA: Insufficient documentation

## 2014-01-23 DIAGNOSIS — M1990 Unspecified osteoarthritis, unspecified site: Secondary | ICD-10-CM | POA: Diagnosis not present

## 2014-01-23 DIAGNOSIS — R51 Headache: Secondary | ICD-10-CM | POA: Diagnosis not present

## 2014-01-23 DIAGNOSIS — R519 Headache, unspecified: Secondary | ICD-10-CM

## 2014-01-23 LAB — COMPREHENSIVE METABOLIC PANEL
ALK PHOS: 60 U/L (ref 39–117)
ALT: 18 U/L (ref 0–53)
ANION GAP: 9 (ref 5–15)
AST: 18 U/L (ref 0–37)
Albumin: 4.4 g/dL (ref 3.5–5.2)
BILIRUBIN TOTAL: 0.4 mg/dL (ref 0.3–1.2)
CHLORIDE: 104 meq/L (ref 96–112)
CO2: 27 mmol/L (ref 19–32)
Calcium: 9.1 mg/dL (ref 8.4–10.5)
Creatinine, Ser: 0.85 mg/dL (ref 0.50–1.35)
GFR calc Af Amer: >90 mL/min (ref >90)
GLUCOSE: 102 mg/dL — AB (ref 70–99)
POTASSIUM: 3.7 mmol/L (ref 3.5–5.1)
Sodium: 140 mmol/L (ref 135–145)
Total Protein: 6.7 g/dL (ref 6.0–8.3)

## 2014-01-23 LAB — CBC WITH DIFFERENTIAL/PLATELET
Basophils Absolute: 0.1 10*3/uL (ref 0.0–0.1)
Basophils Relative: 1 % (ref 0–1)
Eosinophils Absolute: 0.3 10*3/uL (ref 0.0–0.7)
Eosinophils Relative: 4 % (ref 0–5)
HEMATOCRIT: 42.3 % (ref 39.0–52.0)
Hemoglobin: 14.5 g/dL (ref 13.0–17.0)
Lymphocytes Relative: 42 % (ref 12–46)
Lymphs Abs: 3.1 10*3/uL (ref 0.7–4.0)
MCH: 30 pg (ref 26.0–34.0)
MCHC: 34.3 g/dL (ref 30.0–36.0)
MCV: 87.6 fL (ref 78.0–100.0)
MONO ABS: 0.6 10*3/uL (ref 0.1–1.0)
MONOS PCT: 8 % (ref 3–12)
NEUTROS ABS: 3.4 10*3/uL (ref 1.7–7.7)
Neutrophils Relative %: 45 % (ref 43–77)
Platelets: 268 10*3/uL (ref 150–400)
RBC: 4.83 MIL/uL (ref 4.22–5.81)
RDW: 12.8 % (ref 11.5–15.5)
WBC: 7.4 10*3/uL (ref 4.0–10.5)

## 2014-01-23 LAB — RAPID URINE DRUG SCREEN, HOSP PERFORMED
Amphetamines: POSITIVE — AB
Barbiturates: NOT DETECTED
Benzodiazepines: POSITIVE — AB
Cocaine: NOT DETECTED
Opiates: NOT DETECTED
Tetrahydrocannabinol: NOT DETECTED

## 2014-01-23 LAB — ETHANOL: ALCOHOL ETHYL (B): 13 mg/dL — AB (ref 0–9)

## 2014-01-23 NOTE — ED Notes (Signed)
The pt reports that a radio frequency is causing batteries to fail  Whenever he is around machinery.  He cannot keep batteries from dying whenever  He is around  . He has had memory loss and his light bill rose from 20.00 to almost 200.00 dollars whenever he ius home.  The battery on his  Mothers iv pump  Stopped whenever he was beside it.  Radio waves are causing all kinds of mishaps for him including his cell phone

## 2014-01-23 NOTE — ED Provider Notes (Signed)
CSN: [id number]     Arrival date & time 01/23/14  0059 History  This chart was scribed for Lyanne CoKevin M Sarafina Puthoff, MD by Karle PlumberJennifer Tensley, ED Scribe. This patient was seen in room D32C/D32C and the patient's care was started at 1:50 AM.  Chief Complaint  Patient presents with  . Headache   Patient is a 33 y.o. male presenting with headaches. The history is provided by the patient. No language interpreter was used.  Headache   HPI Comments:  Jerry Murphy is a 33 y.o. male who presents to the Emergency Department complaining of moderate bilateral temporal region HA that began in the last couple of weeks. Pt states he wants to be tested to see if the radio frequency from his cell phone is causing his HA.  He states is also noted is some bizarre activity in regards to his phone.  He understands that this sounds like delusions.  He is alert and oriented 3.  He has no prior history of psychiatric illness.  Denies substance abuse.  PMH of fibromyalgia and arthritis.  Past Medical History  Diagnosis Date  . Arthritis   . Fibromyalgia     Rheumatologist/[place].   History reviewed. No pertinent past surgical history. Family History  Problem Relation Age of Onset  . Cancer Mother     pancreatic cancer  . Diabetes Mother   . Diabetes Father   . Cancer Father     pancreatic cancer   History  Substance Use Topics  . Smoking status: Current Every Day Smoker  . Smokeless tobacco: Not on file  . Alcohol Use: No    Review of Systems  Neurological: Positive for headaches.  All other systems reviewed and are negative.  A complete 10 system review of systems was obtained and all systems are negative except as noted in the HPI and PMH.   Allergies  Nsaids  Home Medications   Prior to Admission medications   Medication Sig Start Date End Date Taking? Authorizing Provider  amphetamine-dextroamphetamine (ADDERALL) 20 MG tablet Take 20 mg by mouth 3 (three) times daily. 01/12/14  Yes  Historical Provider, MD  cyclobenzaprine (FLEXERIL) 10 MG tablet Take 10 mg by mouth 3 (three) times daily as needed for muscle spasms.  01/02/14  Yes Historical Provider, MD  diazepam (VALIUM) 2 MG tablet Take 2-4 mg by mouth 3 (three) times daily. Take 1 tab in AM, 1 tab in PM, and 2 tabs at night time 12/27/13  Yes Historical Provider, MD  Multiple Vitamin (MULTIVITAMIN WITH MINERALS) TABS tablet Take 1 tablet by mouth daily.   Yes Historical Provider, MD  polyethylene glycol (MIRALAX / GLYCOLAX) packet Take 17 g by mouth daily as needed for mild constipation.   Yes Historical Provider, MD  traMADol (ULTRAM) 50 MG tablet Take 50 mg by mouth 5 (five) times daily.    Yes Historical Provider, MD  amoxicillin (AMOXIL) 500 MG capsule Take 2 capsules (1,000 mg total) by mouth 2 (two) times daily. Patient not taking: Reported on 01/23/2014 07/20/12   Ethelda ChickKristi M Smith, MD  HYDROcodone-acetaminophen (NORCO/VICODIN) 5-325 MG per tablet Take 1 tablet by mouth every 6 (six) hours as needed for pain. Patient not taking: Reported on 01/23/2014 07/20/12   Ethelda ChickKristi M Smith, MD  HYDROcodone-acetaminophen (NORCO/VICODIN) 5-325 MG per tablet Take 1-2 tablets by mouth every 4 (four) hours as needed. Patient not taking: Reported on 01/23/2014 07/11/13   Ocie CornfieldShari A Upstill, PA-C  HYDROcodone-acetaminophen (NORCO/VICODIN) 5-325 MG per tablet Take 1-2 tablets  by mouth every 6 (six) hours as needed. Patient not taking: Reported on 01/23/2014 08/03/13   Santiago GladHeather Laisure, PA-C  ondansetron (ZOFRAN-ODT) 8 MG disintegrating tablet Take 1 tablet (8 mg total) by mouth every 8 (eight) hours as needed for nausea. Patient not taking: Reported on 01/23/2014 09/20/12   Tarry Kosodd M McGrath, MD  penicillin v potassium (VEETID) 500 MG tablet Take 1 tablet (500 mg total) by mouth 4 (four) times daily. Patient not taking: Reported on 01/23/2014 06/11/12   Sherren MochaEva N Shaw, MD  penicillin v potassium (VEETID) 500 MG tablet Take 1 tablet (500 mg total) by mouth 3  (three) times daily. Patient not taking: Reported on 01/23/2014 07/11/13   Melvenia BeamShari A Upstill, PA-C  promethazine (PHENERGAN) 25 MG tablet Take 1 tablet (25 mg total) by mouth every 6 (six) hours as needed for nausea. Patient not taking: Reported on 01/23/2014 06/15/12   Sherren MochaEva N Shaw, MD  tizanidine (ZANAFLEX) 2 MG capsule Take 2 mg by mouth 3 (three) times daily.    Historical Provider, MD  varenicline (CHANTIX CONTINUING MONTH PAK) 1 MG tablet Take 1 tablet (1 mg total) by mouth 2 (two) times daily. Patient not taking: Reported on 01/23/2014 06/11/12   Sherren MochaEva N Shaw, MD  varenicline (CHANTIX STARTING MONTH PAK) 0.5 MG X 11 & 1 MG X 42 tablet Take one 0.5 mg tablet by mouth once daily for 3 days, then increase to one 0.5 mg tablet twice daily for 4 days, then increase to one 1 mg tablet twice daily. Patient not taking: Reported on 01/23/2014 06/11/12   Sherren MochaEva N Shaw, MD   Triage Vitals: BP 151/93 mmHg  Pulse 124  Temp(Src) 97.8 F (36.6 C)  Resp 20  Ht 5\' 9"  (1.753 m)  Wt 188 lb (85.276 kg)  BMI 27.75 kg/m2  SpO2 100% Physical Exam  Constitutional: He is oriented to person, place, and time. He appears well-developed and well-nourished.  HENT:  Head: Normocephalic and atraumatic.  Eyes: EOM are normal. Pupils are equal, round, and reactive to light.  Neck: Normal range of motion.  Cardiovascular: Normal rate, regular rhythm, normal heart sounds and intact distal pulses.   Pulmonary/Chest: Effort normal and breath sounds normal. No respiratory distress.  Abdominal: Soft. He exhibits no distension. There is no tenderness.  Musculoskeletal: Normal range of motion.  Neurological: He is alert and oriented to person, place, and time.  5/5 strength in major muscle groups of  bilateral upper and lower extremities. Speech normal. No facial asymetry.   Skin: Skin is warm and dry.  Psychiatric: He has a normal mood and affect. Judgment normal.  Nursing note and vitals reviewed.   ED Course  Procedures  (including critical care time) DIAGNOSTIC STUDIES: Oxygen Saturation is 100% on RA, normal by my interpretation.   COORDINATION OF CARE: 1:59 AM- Will CT head. Pt verbalizes understanding and agrees to plan.  Medications - No data to display  Labs Review Labs Reviewed  COMPREHENSIVE METABOLIC PANEL - Abnormal; Notable for the following:    Glucose, Bld 102 (*)    BUN <5 (*)    All other components within normal limits  URINE RAPID DRUG SCREEN (HOSP PERFORMED) - Abnormal; Notable for the following:    Benzodiazepines POSITIVE (*)    Amphetamines POSITIVE (*)    All other components within normal limits  ETHANOL - Abnormal; Notable for the following:    Alcohol, Ethyl (B) 13 (*)    All other components within normal limits  CBC WITH  DIFFERENTIAL    Imaging Review Ct Head Wo Contrast  01/23/2014   CLINICAL DATA:  Acute onset of headache and altered mental status. Memory loss. Initial encounter.  EXAM: CT HEAD WITHOUT CONTRAST  TECHNIQUE: Contiguous axial images were obtained from the base of the skull through the vertex without intravenous contrast.  COMPARISON:  None.  FINDINGS: There is no evidence of acute infarction, mass lesion, or intra- or extra-axial hemorrhage on CT.  The posterior fossa, including the cerebellum, brainstem and fourth ventricle, is within normal limits. The third and lateral ventricles, and basal ganglia are unremarkable in appearance. The cerebral hemispheres are symmetric in appearance, with normal gray-white differentiation. No mass effect or midline shift is seen.  There is no evidence of fracture; visualized osseous structures are unremarkable in appearance. The orbits are within normal limits. There is partial opacification of the sphenoid sinus, frontal sinuses and ethmoid air cells. The remaining paranasal sinuses and mastoid air cells are well-aerated. No significant soft tissue abnormalities are seen.  IMPRESSION: 1. No acute intracranial pathology seen on  CT. 2. Partial opacification of the sphenoid sinus, frontal sinuses and ethmoid air cells.   Electronically Signed   By: Roanna Raider M.D.   On: 01/23/2014 02:32  I personally reviewed the imaging tests through PACS system I reviewed available ER/hospitalization records through the EMR    EKG Interpretation None      MDM   Final diagnoses:  Headache  Altered mental status   CT head is normal.  No frontal lobe masses are noted.  The patient to fully has bizarre behavior.  Much of his thoughts seem somewhat delusional.  I had a frank discussion with the patient and he understands this.  He seems to have a surprising amount of insight.  He has no homicidal or suicidal thoughts.  I asked if he would like to talk with the psychiatrist he stated he would not.  I don't think at this time the patient needs acute psychiatric hospitalization.  I have suggested strongly that he discuss this with the psychiatrist and/or psychologist.  He has a primary care physician and states he will follow-up with him this week.  I've asked that the patient return to the ER for any new or worsening symptoms.  He understands that he seems somewhat fixated on this phone.   I personally performed the services described in this documentation, which was scribed in my presence. The recorded information has been reviewed and is accurate.    Lyanne Co, MD 01/24/14 [phone]

## 2014-01-23 NOTE — Discharge Instructions (Signed)
°Emergency Department Resource Guide °1) Find a Doctor and Pay Out of Pocket °Although you won't have to find out who is covered by your insurance plan, it is a good idea to ask around and get recommendations. You will then need to call the office and see if the doctor you have chosen will accept you as a new patient and what types of options they offer for patients who are self-pay. Some doctors offer discounts or will set up payment plans for their patients who do not have insurance, but you will need to ask so you aren't surprised when you get to your appointment. ° °2) Contact Your Local Health Department °Not all health departments have doctors that can see patients for sick visits, but many do, so it is worth a call to see if yours does. If you don't know where your local health department is, you can check in your phone book. The CDC also has a tool to help you locate your state's health department, and many state websites also have listings of all of their local health departments. ° °3) Find a Walk-in Clinic °If your illness is not likely to be very severe or complicated, you may want to try a walk in clinic. These are popping up all over the country in pharmacies, drugstores, and shopping centers. They're usually staffed by nurse practitioners or physician assistants that have been trained to treat common illnesses and complaints. They're usually fairly quick and inexpensive. However, if you have serious medical issues or chronic medical problems, these are probably not your best option. ° °No Primary Care Doctor: °- Call [facility] at  832-8000 - they can help you locate a primary care doctor that  accepts your insurance, provides certain services, etc. °- Physician Referral Service- 1-800-533-3463 ° °Chronic Pain Problems: °Organization         Address  Phone   Notes  °[facility]  (336) 297-2271 Patients need to be referred by their primary care doctor.  ° °Medication  Assistance: °Organization         Address  Phone   Notes  °[place] [facility] [address] °[place], [place] [address] (336) 641-8030 --Must be a resident of [place] °-- Must have NO insurance coverage whatsoever (no Medicaid/ Medicare, etc.) °-- The pt. MUST have a primary care doctor that directs their care regularly and follows them in the community °  °[facility]  (866) 331-1348   °[employer]  (888) 892-1162   ° °Agencies that provide inexpensive medical care: °Organization         Address  Phone   Notes  °[facility]  (336) 832-8035   °[facility]    (336) 832-7272   °[facility] [address] °[place], [place] [address] (336) 832-4777   °[facility] [address], °[place] (336) 271-4999   °[facility]    (336) 373-0678   °[facility]    (336) 272-1050   °[facility] ° [address], [place] Phone:  (336) 832-4444, Fax:  (336) 832-4440 Hours of Operation:  9 am - 6 pm, M-F.  Also accepts Medicaid/Medicare and self-pay.  °[facility] ° [address], [place] Phone: (336) [phone], Fax: (336) 832-3151. Hours of Operation:  8:30 am - 5:30 pm, M-F.  Also accepts Medicaid and self-pay.  °[facility] [place] [address], [place] Phone: (336) 878-6027   °[facility] [address], [place], [place] (336)723-1848, Ext. 123 Mondays & Thursdays: 7-9 AM.  First 15 patients are seen on a first come, first serve basis. °  ° °Medicaid-accepting [place] Providers: ° °Organization         Address  Phone   Notes  °[facility] [address], [place] (336) 641-2100 Also accepts self-pay patients.  °[facility] [address], [place] ° (336) 856-9996   °[facility] [address], [place]  (336) 288-8857   °[facility] [address] [place] [address], [place] (336) 299-7000   °Veita Bland [address], [place]  ° (336) 373-1557 Only accepts [place] Access Medicaid patients after they have their name applied to their card.  ° °Self-Pay (no insurance) in [place]: ° °Organization         Address  Phone   Notes  °Sickle Cell Patients, [facility] [address], [place] (336) 832-1970   °[facility] [address], [place] (336) 832-4400   °[facility] ° [address] [place] [address], [place] (336) 992-4800   °[facility]/Dr. Osei-Bonsu ° [address] [place] [address], [place] or [address], [place] (336) 841-8500 Phone number for both [place] and [place] locations is the same.  °[facility] [address], [place] (336) 299-0000   °[facility] [address] [place] [address], [place] or [address] (336) 852-7530 °(336) 878-2260   °[facility] [address], [place] (336) 350-1642, phone; (336) 294-5005, fax Sees patients 1st and 3rd Saturday of every month.  Must not qualify for public or private insurance (i.e. Medicaid, Medicare, [place] Health Choice, Veterans' Benefits) • Household income should be no more than 200% of the poverty level •The clinic cannot treat you if you are pregnant or think you are pregnant • Sexually transmitted diseases are not treated at the clinic.  ° ° °Dental Care: °Organization         Address  Phone  Notes  °[place] [facility] [address], [place] (336) 641-6152 Accepts children up to age 21 who are enrolled in Medicaid or [place] Health Choice; pregnant women with a Medicaid card; and children who have applied for Medicaid or [place] Health Choice, but were declined, whose parents can pay a reduced fee at time of service.  °[place]  [facility] [place]  [address], [place] (336) 641-7733 Accepts children up to age 21 who are enrolled in Medicaid or [place] Health Choice; pregnant women with a Medicaid card; and children who have applied for Medicaid or [place] Health Choice, but were declined, whose parents can pay a reduced fee at time of service.  °[facility] ° [address], [place] (336) 641-4533 Patients are seen by appointment only. Walk-ins are not accepted. [facility] will see patients 18 years of age and older. °Monday - Tuesday (8am-5pm) °Most Wednesdays (8:30-5pm) °$30 per visit, cash only  °[facility] ° [address], [place] (336) 641-4533 Patients are seen by appointment only. Walk-ins are not accepted. [facility] will see patients 18 years of age and older. °One   Wednesday Evening (Monthly: Volunteer Based).  $30 per visit, cash only  °[facility]  (919) 537-3737 for adults; Children under age 4, call Graduate Pediatric Dentistry at ([phone]) [phone]. Children aged 4-14, please call ([phone]) [phone] to request a pediatric application. ° Dental services are provided in all areas of dental care including fillings, crowns and bridges, complete and partial dentures, implants, gum treatment, root canals, and extractions. Preventive care is also provided. Treatment is provided to both adults and children. °Patients are selected via a lottery and there is often a waiting list. °  °[facility] [address], °[place] ° (336) 763-8833 [email or web address] °  °[facility] [address], [place], [place] (336)723-1848, Ext. 123 Second and Fourth Thursday of each month, opens at 6:30 AM; Clinic ends at 9 AM.  Patients are seen on a first-come first-served basis, and a limited number are seen during each clinic.  ° °[facility] ° [address], [place], [place] (336) 723-7904    Eligibility Requirements °You must have lived in [place], [place], or [place] counties for at least the last three months. °  You cannot be eligible for state or federal sponsored healthcare insurance, including Veterans Administration, Medicaid, or Medicare. °  You generally cannot be eligible for healthcare insurance through your employer.  °  How to apply: °Eligibility screenings are held every Tuesday and Wednesday afternoon from 1:00 pm until 4:00 pm. You do not need an appointment for the interview!  °[facility] [address], [place], [place] 336-631-2330   °[facility]  336-342-8273   °[facility]  336-703-3100   °[facility]  336-570-6415   ° °Behavioral Health Resources in the Community: °Intensive Outpatient Programs °Organization         Address  Phone  Notes  °[facility] [address], [place], [place] 336-878-6098   °[facility] [address], [place], [place] 336-832-9800   °[facility] [address], [place], [place] ° 336-882-2125   °[facility] [address],  °[place], [place] 1-800-853-5163 or 336-641-4981   °Substance Abuse Resources °Organization         Address  Phone  Notes  °[facility]  336-882-2125   °[facility]  336-784-9470   °[facility]  336-285-9073   °[facility]  336-845-3988   °Residential & Outpatient Substance Abuse Program  1-800-659-3381   °Psychological Services °Organization         Address  Phone  Notes  °[facility]  336- 832-9600   °[facility]  336- 378-7881   °[facility] [address], [place] 1-800-853-5163 or 336-641-4981   ° °Mobile Crisis Teams °Organization         Address  Phone  Notes  °[facility], Mobile Crisis Care Unit  1-877-626-1772   °Assertive °[facility] ° [address]  [place], [place] 336-834-9664   °Sharon DeEsch [address] °[place]  336-554-5454   ° °Self-Help/Support Groups °Organization         Address  Phone             Notes  °[facility]  - variety of support groups  336- 373-1402 Call for more information  °Narcotics Anonymous (NA), [facility] [address], °[place]   2 meetings at this location  ° °  Residential Treatment Programs °Organization         Address  Phone  Notes  °[facility] [address],    °[place]  1-866-801-8205   °[facility] ° [address], [place], [place] 704-293-8524   °[facility] [address], [place] 336-845-3988 Admissions: 8am-3pm M-F  °[facility] [address]    °[place], [place] 336-841-1104   °[facility] [address], [place], [place] 336-379-7146   °[facility] [address]  °[place], [place] 336-285-9073   °[facility] Intensive Outpatient [address], [place], [place] 336-852-3033   °[facility] [address]  °[place], [place] 1-877-615-2722 or 336-784-9470   °[facility] [address] [place], [place] 336-227-7417 Accepts Medicaid  °[facility] [address]  °[place] 1-800-659-3381 Substance Abuse/Addiction Treatment  ° °[place] Behavioral Health Resources °Organization         Address  Phone  Notes  °[facility]  (888) 581-9988   °Julie Brannon, PhD [address] [place], [place]   (336) 349-5553 or (336) 951-0000   °[facility]   [address] °[place], [place] (336) 349-4454   °[facility] [address], [place], [place] (336) 342-8316 Insurance/Medicaid/sponsorship through [facility]  °[facility] [address]                                    [place], [place] (336) 342-8316 Therapy/tele-psych/case    °[facility] [address]  ° [place], [place] (336) 349-2233    °Dr. Arfeen  (336) 349-4544   °[facility] [place]  [employer] [place] [facility] 1) [address],  °2) [address], [place] °3)  [address], [place] (336) 349-3220 °(336) 342-7768 ° °(336) 342-8140   °[place] [facility] (336) 342-1394 or (336) 342-3537 (After Hours)    ° ° °

## 2014-01-23 NOTE — ED Notes (Signed)
Pt a/o x 4 with steady gait. 

## 2014-03-22 ENCOUNTER — Encounter (HOSPITAL_COMMUNITY): Payer: Self-pay | Admitting: *Deleted

## 2014-03-22 ENCOUNTER — Emergency Department (HOSPITAL_COMMUNITY)
Admission: EM | Admit: 2014-03-22 | Discharge: 2014-03-22 | Disposition: A | Discharge status: Home / Self Care | Payer: BLUE CROSS/BLUE SHIELD | Attending: Emergency Medicine | Admitting: Emergency Medicine

## 2014-03-22 DIAGNOSIS — T404X5A Adverse effect of other synthetic narcotics, initial encounter: Secondary | ICD-10-CM | POA: Insufficient documentation

## 2014-03-22 DIAGNOSIS — Z720 Tobacco use: Secondary | ICD-10-CM | POA: Insufficient documentation

## 2014-03-22 DIAGNOSIS — M62838 Other muscle spasm: Secondary | ICD-10-CM | POA: Insufficient documentation

## 2014-03-22 DIAGNOSIS — T50905A Adverse effect of unspecified drugs, medicaments and biological substances, initial encounter: Secondary | ICD-10-CM

## 2014-03-22 DIAGNOSIS — F909 Attention-deficit hyperactivity disorder, unspecified type: Secondary | ICD-10-CM | POA: Insufficient documentation

## 2014-03-22 DIAGNOSIS — Z79899 Other long term (current) drug therapy: Secondary | ICD-10-CM | POA: Diagnosis not present

## 2014-03-22 DIAGNOSIS — T481X5A Adverse effect of skeletal muscle relaxants [neuromuscular blocking agents], initial encounter: Secondary | ICD-10-CM | POA: Diagnosis not present

## 2014-03-22 LAB — URINALYSIS, ROUTINE W REFLEX MICROSCOPIC
Bilirubin Urine: NEGATIVE
GLUCOSE, UA: NEGATIVE mg/dL
Hgb urine dipstick: NEGATIVE
KETONES UR: NEGATIVE mg/dL
Leukocytes, UA: NEGATIVE
Nitrite: NEGATIVE
PH: 6.5 (ref 5.0–8.0)
Protein, ur: NEGATIVE mg/dL
Specific Gravity, Urine: 1.002 — ABNORMAL LOW (ref 1.005–1.030)
Urobilinogen, UA: 0.2 mg/dL (ref 0.0–1.0)

## 2014-03-22 LAB — CBC WITH DIFFERENTIAL/PLATELET
BASOS PCT: 0 % (ref 0–1)
Basophils Absolute: 0 10*3/uL (ref 0.0–0.1)
EOS ABS: 0.2 10*3/uL (ref 0.0–0.7)
Eosinophils Relative: 3 % (ref 0–5)
HEMATOCRIT: 44.3 % (ref 39.0–52.0)
HEMOGLOBIN: 15.1 g/dL (ref 13.0–17.0)
Lymphocytes Relative: 38 % (ref 12–46)
Lymphs Abs: 3.3 10*3/uL (ref 0.7–4.0)
MCH: 30.4 pg (ref 26.0–34.0)
MCHC: 34.1 g/dL (ref 30.0–36.0)
MCV: 89.1 fL (ref 78.0–100.0)
MONO ABS: 0.7 10*3/uL (ref 0.1–1.0)
MONOS PCT: 8 % (ref 3–12)
NEUTROS PCT: 51 % (ref 43–77)
Neutro Abs: 4.4 10*3/uL (ref 1.7–7.7)
Platelets: 432 10*3/uL — ABNORMAL HIGH (ref 150–400)
RBC: 4.97 MIL/uL (ref 4.22–5.81)
RDW: 12.9 % (ref 11.5–15.5)
WBC: 8.5 10*3/uL (ref 4.0–10.5)

## 2014-03-22 LAB — COMPREHENSIVE METABOLIC PANEL
ALT: 35 U/L (ref 0–53)
AST: 23 U/L (ref 0–37)
Albumin: 4.8 g/dL (ref 3.5–5.2)
Alkaline Phosphatase: 70 U/L (ref 39–117)
Anion gap: 7 (ref 5–15)
BUN: 8 mg/dL (ref 6–23)
CALCIUM: 9.4 mg/dL (ref 8.4–10.5)
CO2: 29 mmol/L (ref 19–32)
Chloride: 102 mmol/L (ref 96–112)
Creatinine, Ser: 0.83 mg/dL (ref 0.50–1.35)
GFR calc Af Amer: >90 mL/min (ref >90)
GFR calc non Af Amer: >90 mL/min (ref >90)
Glucose, Bld: 100 mg/dL — ABNORMAL HIGH (ref 70–99)
Potassium: 3.5 mmol/L (ref 3.5–5.1)
SODIUM: 138 mmol/L (ref 135–145)
Total Bilirubin: 0.5 mg/dL (ref 0.3–1.2)
Total Protein: 8 g/dL (ref 6.0–8.3)

## 2014-03-22 MED ORDER — ONDANSETRON 8 MG PO TBDP: 8.0000 mg | ORAL_TABLET | Freq: Three times a day (TID) | ORAL | Status: AC | PRN

## 2014-03-22 NOTE — ED Notes (Signed)
Patient states he is on multiple medications, and is afraid that he his having a medication reaction. Patient states he is confused, sweating throughout the day, weak, muscle pain, eyes twitching and diarrhea X2 weeks.

## 2014-03-22 NOTE — Discharge Instructions (Signed)
Your symptoms may be due to an interaction of your Flexeril and tramadol.  It is recommended that you stop these medications. It is recommended to stop taking the Flexeril and decrease your dose of Ultram over the course of a week until you're no longer taking it.  Please follow-up with your prescribing doctors to discuss your medications.  It is also recommended that you find a therapist that you can do talk therapy with.  Return to the emergency department for worsening condition or new concerning symptoms.   Drug Toxicity You are having a reaction to your medicine. This does not mean you are allergic to the drug. Medicines can have many different side effects and toxic reactions. These include:  Stomach symptoms, such as nausea, vomiting, cramps, diarrhea, bloating, constipation, and dry mouth.  Nervous system symptoms, such as weakness, muscle spasms, drowsiness, confusion, agitation, and headache.  Heart and blood vessel symptoms, such as fainting, irregular heartbeat (palpitations), and fast heartbeat.  Skin symptoms, such as itching, light sensitivity, and rashes. When taking more medicines, there is an increased chance of a drug interaction that can make you sick. Take your medicines as your caregiver recommends. Keep a list of the names and doses of each of your drugs. Avoid alcohol and street drugs. Call your caregiver if you are worried about drug side effects or toxicity. Document Released: 01/19/2005 Document Revised: 04/13/2011 Document Reviewed: 07/06/2006 [facility] Patient Information 2015 [place], [place]. This information is not intended to replace advice given to you by your health care provider. Make sure you discuss any questions you have with your health care provider.   Polypharmacy Problems Polypharmacy problems can occur when you take more than one medicine. Your caregivers need to know about all the medicines you take. This includes vitamins, herbs, eyedrops,  over-the-counter medicines, prescription medicines, and creams. Drug interaction problems can include:  Bad reactions or side effects. This can occur when certain drugs are taken together.  Reduced benefit from your medicines. This can occur if one drug reduces the ability of another drug to work. Some drug interaction problems can be life-threatening. Your caregivers can coordinate a plan to help you avoid polypharmacy problems. RISK FACTORS Polypharmacy problems are more likely to occur if:  You have many caregivers prescribing different medicines for you. This is a common problem for elderly patients.  You have a long-term (chronic) medical condition.  You have had an organ transplant.  You have human immunodeficiency virus (HIV).  You are undergoing chemotherapy.  You have hepatitis.  You have kidney disease or kidney failure.  You have significant heart disease or lung disease.  You are elderly.  You are taking medicines that have a low margin of error. This means there is little difference between the right amount of medicine and too much medicine. Medicines in this category include:  Warfarin.  Digoxin.  Lithium.  Theophylline.  Monoamine oxidase (MAO) inhibitors.  Seizure medicines.  Cyclosporine. PREVENTION   Choose one caregiver to be in charge of coordinating your medicines. Inform this caregiver about all medicines and supplements you take, even if they are prescribed by another caregiver.  Purchase all your medicines through the same pharmacy. The pharmacy keeps track of your medicines and possible drug interactions. They can notify your caregiver of potential problems.  Read the information given to you at your pharmacy.  Carry a list of all your medicines and their doses with you. This informs your caregivers, emergency department caregivers, and specialists about the medicines you  are taking. This is especially important before you start a new  medicine.  Use a system to keep track of which medicines you are taking and when. Many patients use a pillbox that separates medicines by the day and time they are supposed to be taken.  Carry a list of your chronic medical problems with you. Conditions such as kidney problems, liver problems, organ transplants, and chronic viral infections affect the way your body handles medicines.  Ask your caregiver or pharmacist if you have any questions about your medicines. SEEK MEDICAL CARE IF:  You have any problems that may be caused by your medicines.  You have chest pain.  You have shortness of breath.  You have an irregular heartbeat.  You have fainting spells.  You have shaking or tremors.  You have weakness or tiredness (lethargy).  You have a rash or swelling in any part of the body.  You have increased bleeding, rectal bleeding, vaginal bleeding, or you bruise easily.  You have abdominal pain.  You have nausea.  You have vomiting. Document Released: 02/27/2004 Document Revised: 04/13/2011 Document Reviewed: 10/08/2010 [facility] Patient Information 2015 [place], [place]. This information is not intended to replace advice given to you by your health care provider. Make sure you discuss any questions you have with your health care provider.

## 2014-03-22 NOTE — ED Notes (Signed)
Pt states that he thinks he is having a chemical reaction to his medications; pt states that he has been doing research and thinks he has a Serotonin imbalance; pt is requesting to be checked for this; pt reports that he get confused during the day; intermittent sweatiness, diarrhea, increased anxiety and eye twitching

## 2014-03-22 NOTE — ED Provider Notes (Signed)
CSN: [id number]     Arrival date & time 03/22/14  0027 History   First MD Initiated Contact with Patient 03/22/14 0118     Chief Complaint  Patient presents with  . Medication Reaction     (Consider location/radiation/quality/duration/timing/severity/associated sxs/prior Treatment) HPI 34 year old male presents to emergency department with complaint of possible medication side effect.  Patient reports for the last 2 weeks he has had increased muscle spasms, anxiety, confusion.  Patient reports he is currently on Adderall for ADHD, Valium, Ultram and Flexeril for fibromyalgia.  He is followed by psychiatrist and primary care doctor.  He reports no recent changes to his medication, no overuse of medication.  Several months ago, Zanaflex was DC'd and he was started on Flexeril.  Patient feels that he may have serotonin syndrome based on research that he is done on the Internet.  Here describes generalized weakness, sweating, eyes twitching, diarrhea.  He reports the symptoms are worse during the day and get better in the evening time.  He denies any illicit substances.  He reports that he has been under more stress recently since the death of his mother.  He denies any SI, HI or hallucinations.  He reports anxiety and depression. Past Medical History  Diagnosis Date  . Arthritis   . Fibromyalgia     Rheumatologist/[place].   History reviewed. No pertinent past surgical history. Family History  Problem Relation Age of Onset  . Cancer Mother     pancreatic cancer  . Diabetes Mother   . Diabetes Father   . Cancer Father     pancreatic cancer   History  Substance Use Topics  . Smoking status: Current Every Day Smoker  . Smokeless tobacco: Not on file  . Alcohol Use: No    Review of Systems   See History of Present Illness; otherwise all other systems are reviewed and negative  Allergies  Nsaids  Home Medications   Prior to Admission medications   Medication Sig Start Date End  Date Taking? Authorizing Provider  amphetamine-dextroamphetamine (ADDERALL) 20 MG tablet Take 20 mg by mouth 3 (three) times daily. 01/12/14  Yes Historical Provider, MD  diazepam (VALIUM) 2 MG tablet Take 2-4 mg by mouth 3 (three) times daily. Take 1 tab in AM, 1 tab in PM, and 2 tabs at night time 12/27/13  Yes Historical Provider, MD  Multiple Vitamin (MULTIVITAMIN WITH MINERALS) TABS tablet Take 1 tablet by mouth daily.   Yes Historical Provider, MD  polyethylene glycol (MIRALAX / GLYCOLAX) packet Take 17 g by mouth daily as needed for mild constipation.   Yes Historical Provider, MD  ondansetron (ZOFRAN-ODT) 8 MG disintegrating tablet Take 1 tablet (8 mg total) by mouth every 8 (eight) hours as needed for nausea. 03/22/14   Olivia Mackielga M Adelard Sanon, MD   BP 127/73 mmHg  Pulse 100  Temp(Src) 98.3 F (36.8 C) (Oral)  Resp 20  SpO2 98% Physical Exam  Constitutional: He is oriented to person, place, and time. He appears well-developed and well-nourished. No distress.  HENT:  Head: Normocephalic and atraumatic.  Nose: Nose normal.  Mouth/Throat: Oropharynx is clear and moist.  Eyes: Conjunctivae and EOM are normal. Pupils are equal, round, and reactive to light.  Neck: Normal range of motion. Neck supple. No JVD present. No tracheal deviation present. No thyromegaly present.  Cardiovascular: Normal rate, regular rhythm, normal heart sounds and intact distal pulses.  Exam reveals no gallop and no friction rub.   No murmur heard. Pulmonary/Chest: Effort normal  and breath sounds normal. No stridor. No respiratory distress. He has no wheezes. He has no rales. He exhibits no tenderness.  Abdominal: Soft. Bowel sounds are normal. He exhibits no distension and no mass. There is no tenderness. There is no rebound and no guarding.  Musculoskeletal: Normal range of motion. He exhibits no edema or tenderness.  Lymphadenopathy:    He has no cervical adenopathy.  Neurological: He is alert and oriented to person,  place, and time. He displays normal reflexes. No cranial nerve deficit. He exhibits normal muscle tone. Coordination normal.  Patient does not have any hyperreflexia on exam, no clonus.  No nystagmus  Skin: Skin is warm and dry. No rash noted. No erythema. No pallor.  Psychiatric: His behavior is normal. Judgment and thought content normal.  Anxious, mild pressured speech  Nursing note and vitals reviewed.   ED Course  Procedures (including critical care time) Labs Review Labs Reviewed  URINALYSIS, ROUTINE W REFLEX MICROSCOPIC - Abnormal; Notable for the following:    Specific Gravity, Urine 1.002 (*)    All other components within normal limits  COMPREHENSIVE METABOLIC PANEL - Abnormal; Notable for the following:    Glucose, Bld 100 (*)    All other components within normal limits  CBC WITH DIFFERENTIAL/PLATELET - Abnormal; Notable for the following:    Platelets 432 (*)    All other components within normal limits    Imaging Review No results found.   EKG Interpretation   Date/Time:  Thursday March 22 2014 01:59:15 EST Ventricular Rate:  101 PR Interval:  123 QRS Duration: 97 QT Interval:  329 QTC Calculation: 426 R Axis:   92 Text Interpretation:  Sinus tachycardia Borderline right axis deviation   early repol pattern No old tracing to compare Confirmed by Aryka Coonradt  MD, Vitaliy Eisenhour  ([id number]) on 03/22/2014 2:35:17 AM      MDM   Final diagnoses:  Medication reaction, initial encounter   34 year old male with multiple symptoms that may be secondary to use of both Flexeril and Ultram at the same time.  I do not feel that he has true serotonin syndrome, he is not hyper pyrexia, no tachycardia, no hypertension.  No hyperreflexia.  He does not have an altered mental status.  Patient may have some increase in his serotonin due to the medications.  I have recommended that he stop taking the Flexeril and taper off his Ultram.  He is also instructed to follow-up with his primary care  doctor, as well as psychiatrist.  Loney Laurence today is unremarkable.    Olivia Mackie, MD 03/22/14 [phone]

## 2014-03-22 NOTE — ED Notes (Signed)
Patient verbalizes understanding of discharge instructions, prescription medications, home care ad follow up care. Patient ambulatory out of department at this time.

## 2014-05-17 ENCOUNTER — Encounter (HOSPITAL_COMMUNITY): Payer: Self-pay | Admitting: *Deleted

## 2014-05-17 ENCOUNTER — Emergency Department (HOSPITAL_COMMUNITY)
Admission: EM | Admit: 2014-05-17 | Discharge: 2014-05-17 | Discharge status: Against Medical Advice | Payer: BLUE CROSS/BLUE SHIELD | Attending: Emergency Medicine | Admitting: Emergency Medicine

## 2014-05-17 DIAGNOSIS — Z720 Tobacco use: Secondary | ICD-10-CM | POA: Insufficient documentation

## 2014-05-17 DIAGNOSIS — R110 Nausea: Secondary | ICD-10-CM | POA: Diagnosis not present

## 2014-05-17 DIAGNOSIS — R52 Pain, unspecified: Secondary | ICD-10-CM | POA: Diagnosis not present

## 2014-05-17 DIAGNOSIS — R05 Cough: Secondary | ICD-10-CM | POA: Insufficient documentation

## 2014-05-17 DIAGNOSIS — R197 Diarrhea, unspecified: Secondary | ICD-10-CM | POA: Insufficient documentation

## 2014-05-17 DIAGNOSIS — R0981 Nasal congestion: Secondary | ICD-10-CM | POA: Diagnosis not present

## 2014-05-17 DIAGNOSIS — R509 Fever, unspecified: Secondary | ICD-10-CM | POA: Insufficient documentation

## 2014-05-17 NOTE — ED Notes (Signed)
Pt states "I just realized that this is the same place that got my name mixed up last time, I would rather just go see my doctor in the morning"; pt declined to sign AMA form

## 2014-05-17 NOTE — ED Notes (Signed)
Pt states that he has not felt well in 4 days; pt c/o nausea, diarrhea, generalized body aches, congestion and cough; pt states that he had vomiting the first day but none since; pt c/o diarrhea x 10 times today; pt states that he has a cough, non-productive; pt reports fever at home; afebrile in the ER

## 2014-10-01 ENCOUNTER — Encounter: Payer: Self-pay | Admitting: Emergency Medicine

## 2014-10-01 ENCOUNTER — Emergency Department
Admission: EM | Admit: 2014-10-01 | Discharge: 2014-10-01 | Disposition: A | Discharge status: Home / Self Care | Payer: BLUE CROSS/BLUE SHIELD | Attending: Emergency Medicine | Admitting: Emergency Medicine

## 2014-10-01 DIAGNOSIS — Z79899 Other long term (current) drug therapy: Secondary | ICD-10-CM | POA: Insufficient documentation

## 2014-10-01 DIAGNOSIS — K029 Dental caries, unspecified: Secondary | ICD-10-CM | POA: Insufficient documentation

## 2014-10-01 DIAGNOSIS — Z720 Tobacco use: Secondary | ICD-10-CM | POA: Insufficient documentation

## 2014-10-01 DIAGNOSIS — K047 Periapical abscess without sinus: Secondary | ICD-10-CM

## 2014-10-01 MED ORDER — HYDROCODONE-ACETAMINOPHEN 5-325 MG PO TABS: 1.0000 | ORAL_TABLET | ORAL | Status: AC | PRN

## 2014-10-01 MED ORDER — AMOXICILLIN 500 MG PO TABS
500.0000 mg | ORAL_TABLET | Freq: Two times a day (BID) | ORAL | Status: DC
Start: 2014-10-01 — End: 2015-10-14

## 2014-10-01 MED ORDER — LIDOCAINE VISCOUS 2 % MT SOLN: 20.0000 mL | OROMUCOSAL | Status: AC | PRN

## 2014-10-01 NOTE — Discharge Instructions (Signed)
Dental Pain °A tooth ache may be caused by cavities (tooth decay). Cavities expose the nerve of the tooth to air and hot or cold temperatures. It may come from an infection or abscess (also called a boil or furuncle) around your tooth. It is also often caused by dental caries (tooth decay). This causes the pain you are having. °DIAGNOSIS  °Your caregiver can diagnose this problem by exam. °TREATMENT  °· If caused by an infection, it may be treated with medications which kill germs (antibiotics) and pain medications as prescribed by your caregiver. Take medications as directed. °· Only take over-the-counter or prescription medicines for pain, discomfort, or fever as directed by your caregiver. °· Whether the tooth ache today is caused by infection or dental disease, you should see your dentist as soon as possible for further care. °SEEK MEDICAL CARE IF: °The exam and treatment you received today has been provided on an emergency basis only. This is not a substitute for complete medical or dental care. If your problem worsens or new problems (symptoms) appear, and you are unable to meet with your dentist, call or return to this location. °SEEK IMMEDIATE MEDICAL CARE IF:  °· You have a fever. °· You develop redness and swelling of your face, jaw, or neck. °· You are unable to open your mouth. °· You have severe pain uncontrolled by pain medicine. °MAKE SURE YOU:  °· Understand these instructions. °· Will watch your condition. °· Will get help right away if you are not doing well or get worse. °Document Released: 01/19/2005 Document Revised: 04/13/2011 Document Reviewed: 09/07/2007 °ExitCare® Patient Information ©2015 [employer]. This information is not intended to replace advice given to you by your health care provider. Make sure you discuss any questions you have with your health care provider. ° °Dental Caries °Dental caries (also called tooth decay) is the most common oral disease. It can occur at any age but is  more common in children and young adults.  °HOW DENTAL CARIES DEVELOPS  °The process of decay begins when bacteria and foods (particularly sugars and starches) combine in your mouth to produce plaque. Plaque is a substance that sticks to the hard, outer surface of a tooth (enamel). The bacteria in plaque produce acids that attack enamel. These acids may also attack the root surface of a tooth (cementum) if it is exposed. Repeated attacks dissolve these surfaces and create holes in the tooth (cavities). If left untreated, the acids destroy the other layers of the tooth.  °RISK FACTORS °· Frequent sipping of sugary beverages.   °· Frequent snacking on sugary and starchy foods, especially those that easily get stuck in the teeth.   °· Poor oral hygiene.   °· Dry mouth.   °· Substance abuse such as methamphetamine abuse.   °· Broken or poor-fitting dental restorations.   °· Eating disorders.   °· Gastroesophageal reflux disease (GERD).   °· Certain radiation treatments to the head and neck. °SYMPTOMS °In the early stages of dental caries, symptoms are seldom present. Sometimes white, chalky areas may be seen on the enamel or other tooth layers. In later stages, symptoms may include: °· Pits and holes on the enamel. °· Toothache after sweet, hot, or cold foods or drinks are consumed. °· Pain around the tooth. °· Swelling around the tooth. °DIAGNOSIS  °Most of the time, dental caries is detected during a regular dental checkup. A diagnosis is made after a thorough medical and dental history is taken and the surfaces of your teeth are checked for signs of   dental caries. Sometimes special instruments, such as lasers, are used to check for dental caries. Dental X-ray exams may be taken so that areas not visible to the eye (such as between the contact areas of the teeth) can be checked for cavities.  °TREATMENT  °If dental caries is in its early stages, it may be reversed with a fluoride treatment or an application of a  remineralizing agent at the dental office. Thorough brushing and flossing at home is needed to aid these treatments. If it is in its later stages, treatment depends on the location and extent of tooth destruction:  °· If a small area of the tooth has been destroyed, the destroyed area will be removed and cavities will be filled with a material such as gold, silver amalgam, or composite resin.   °· If a large area of the tooth has been destroyed, the destroyed area will be removed and a cap (crown) will be fitted over the remaining tooth structure.   °· If the center part of the tooth (pulp) is affected, a procedure called a root canal will be needed before a filling or crown can be placed.   °· If most of the tooth has been destroyed, the tooth may need to be pulled (extracted). °HOME CARE INSTRUCTIONS °You can prevent, stop, or reverse dental caries at home by practicing good oral hygiene. Good oral hygiene includes: °· Thoroughly cleaning your teeth at least twice a day with a toothbrush and dental floss.   °· Using a fluoride toothpaste. A fluoride mouth rinse may also be used if recommended by your dentist or health care provider.   °· Restricting the amount of sugary and starchy foods and sugary liquids you consume.   °· Avoiding frequent snacking on these foods and sipping of these liquids.   °· Keeping regular visits with a dentist for checkups and cleanings. °PREVENTION  °· Practice good oral hygiene. °· Consider a dental sealant. A dental sealant is a coating material that is applied by your dentist to the pits and grooves of teeth. The sealant prevents food from being trapped in them. It may protect the teeth for several years. °· Ask about fluoride supplements if you live in a community without fluorinated water or with water that has a low fluoride content. Use fluoride supplements as directed by your dentist or health care provider. °· Allow fluoride varnish applications to teeth if directed by your  dentist or health care provider. °Document Released: 10/11/2001 Document Revised: 06/05/2013 Document Reviewed: 01/22/2012 °ExitCare® Patient Information ©2015 [employer]. This information is not intended to replace advice given to you by your health care provider. Make sure you discuss any questions you have with your health care provider. ° °OPTIONS FOR DENTAL FOLLOW UP CARE ° °[place] [facility] [employer] - Local Safety Net Dental Clinics °http://www.ncdhhs.gov/dph/oralhealth/services/safetynetclinics.htm °  °[facility] (336-562-3123) ° °[facility] (919-933-9087) ° °[facility] (919-663-1744 ext 237) ° ° [facility] (336-570-6415) ° °[facility] (919-968-2025) °This clinic caters to the indigent population and is on a lottery system. °Location: °[facility], [address], [place] °Clinic Hours: °Wednesdays from 6pm - 9pm, patients seen by a lottery system. °For dates, call or go to www.med.[facility].edu/[facility]/patients/Dental-[facility] °Services: °Cleanings, fillings and simple extractions. °Payment Options: °DENTAL WORK IS FREE OF CHARGE. Bring proof of income or support. °Best way to get seen: °Arrive at 5:15 pm - this is a lottery, NOT first come/first serve, so arriving earlier will not increase   your chances of being seen. °  °  °[facility] °919-537-3737 °Select option 1 for emergencies °  °Location: °[facility], [address], [place] °Clinic Hours: °No walk-ins accepted - call the day before to schedule an appointment. °Check in times are 9:30 am and 1:30 pm. °Services: °Simple extractions, temporary fillings, pulpectomy/pulp debridement, uncomplicated abscess drainage. °Payment Options: °PAYMENT IS DUE AT THE TIME OF SERVICE.  Fee is usually $100-200, additional surgical procedures (e.g. abscess drainage) may be extra. °Cash, checks, Visa/MasterCard  accepted.  Can file Medicaid if patient is covered for dental - patient should call case worker to check. °No discount for [facility] patients. °Best way to get seen: °MUST call the day before and get onto the schedule. Can usually be seen the next 1-2 days. No walk-ins accepted. °  °  °[facility] °919-933-9087 °  °Location: °[facility], [address], [place] °Clinic Hours: °M, W, Th, F 8am or 1:30pm, Tues 9a or 1:30 - first come/first served. °Services: °Simple extractions, temporary fillings, uncomplicated abscess drainage.  You do not need to be an [place] resident. °Payment Options: °PAYMENT IS DUE AT THE TIME OF SERVICE. °Dental insurance, otherwise sliding scale - bring proof of income or support. °Depending on income and treatment needed, cost is usually $50-200. °Best way to get seen: °Arrive early as it is first come/first served. °  °  °[facility] °919-542-1641 °  °Location: °[address] °Clinic Hours: °Mon-Thu 8a-5p °Services: °Most basic dental services including extractions and fillings. °Payment Options: °PAYMENT IS DUE AT THE TIME OF SERVICE. °Sliding scale, up to 50% off - bring proof if income or support. °Medicaid with dental option accepted. °Best way to get seen: °Call to schedule an appointment, can usually be seen within 2 weeks OR they will try to see walk-ins - show up at 8a or 2p (you may have to wait). °  °  °[facility] °919-245-2435 °[place] RESIDENTS ONLY °  °Location: °[facility], [address], [place], [place] [address] °Clinic Hours: By appointment only. °Monday - Thursday 8am-5pm, Friday 8am-12pm °Services: Cleanings, fillings, extractions. °Payment Options: °PAYMENT IS DUE AT THE TIME OF SERVICE. °Cash, Visa or MasterCard. Sliding scale - $30 minimum per service. °Best way to get seen: °Come in to office, complete packet and make an  appointment - need proof of income °or support monies for each household member and proof of [place] residence. °Usually takes about a month to get in. °  °  °[facility] °919-956-4038 °  °Location: °[address] [place] °Clinic Hours: Walk-in Urgent Care Dental Services are offered Monday-Friday mornings only. °The numbers of emergencies accepted daily is limited to the number of °providers available. °Maximum 15 - Mondays, Wednesdays & Thursdays °Maximum 10 - Tuesdays & Fridays °Services: °You do not need to be a [place] resident to be seen for a dental emergency. °Emergencies are defined as pain, swelling, abnormal bleeding, or dental trauma. Walkins will receive x-rays if needed. °NOTE: Dental cleaning is not an emergency. °Payment Options: °PAYMENT IS DUE AT THE TIME OF SERVICE. °Minimum co-pay is $40.00 for uninsured patients. °Minimum co-pay is $3.00 for Medicaid with dental coverage. °Dental Insurance is accepted and must be presented at time of visit. °Medicare does not cover dental. °Forms of payment: Cash, credit card, checks. °Best way to get seen: °If   not previously registered with the clinic, walk-in dental registration begins at 7:15 am and is on a first come/first serve basis. °If previously registered with the clinic, call to make an appointment. °  °  °[facility] °919-776-4359 °[place] RESIDENTS ONLY °  °Location: °[address], [place], [place] °Clinic Hours: °Mon-Thu 10a-2p °Services: Extractions only! °Payment Options: °FREE (donations accepted) - bring proof of income or support °Best way to get seen: °Call and schedule an appointment OR come at 8am on the 1st Monday of every month (except for holidays) when it is first come/first served. °  °  °[facility] °[phone] °  °Location: °[address], [place] °Clinic Hours: °Friday mornings °Services, Payment Options, Best way to get seen: °Call for info °

## 2014-10-01 NOTE — ED Notes (Signed)
Tooth pain since last pm

## 2014-10-01 NOTE — ED Provider Notes (Signed)
[facility] Emergency Department Provider Note  ____________________________________________  Time seen: Approximately 3:56 PM  I have reviewed the triage vital signs and the nursing notes.   HISTORY  Chief Complaint Dental Pain    HPI Jerry Murphy is a 34 y.o. male who presents with dental pain since last night. Patient states that he's had dental pains on and off for a while and will be going to school dentistry to get his tooth removed. Present rates pain as 10 over 10. Last time this turned into an abscess per his history.   Past Medical History  Diagnosis Date  . Arthritis   . Fibromyalgia     Rheumatologist/[place].    There are no active problems to display for this patient.   History reviewed. No pertinent past surgical history.  Current Outpatient Rx  Name  Route  Sig  Dispense  Refill  . amoxicillin (AMOXIL) 500 MG tablet   Oral   Take 1 tablet (500 mg total) by mouth 2 (two) times daily.   20 tablet   0   . amphetamine-dextroamphetamine (ADDERALL) 20 MG tablet   Oral   Take 20 mg by mouth 3 (three) times daily.      0   . diazepam (VALIUM) 2 MG tablet   Oral   Take 2-4 mg by mouth 3 (three) times daily. Take 1 tab in AM, 1 tab in PM, and 2 tabs at night time         . HYDROcodone-acetaminophen (NORCO) 5-325 MG per tablet   Oral   Take 1-2 tablets by mouth every 4 (four) hours as needed for moderate pain.   8 tablet   0   . lidocaine (XYLOCAINE) 2 % solution   Mouth/Throat   Use as directed 20 mLs in the mouth or throat as needed for mouth pain.   100 mL   0   . Multiple Vitamin (MULTIVITAMIN WITH MINERALS) TABS tablet   Oral   Take 1 tablet by mouth daily.         . ondansetron (ZOFRAN-ODT) 8 MG disintegrating tablet   Oral   Take 1 tablet (8 mg total) by mouth every 8 (eight) hours as needed for nausea.   10 tablet   0   . polyethylene glycol (MIRALAX / GLYCOLAX) packet   Oral   Take 17 g by  mouth daily as needed for mild constipation.           Allergies Nsaids  Family History  Problem Relation Age of Onset  . Cancer Mother     pancreatic cancer  . Diabetes Mother   . Diabetes Father   . Cancer Father     pancreatic cancer    Social History Social History  Substance Use Topics  . Smoking status: Current Every Day Smoker  . Smokeless tobacco: None  . Alcohol Use: No    Review of Systems Constitutional: No fever/chills Eyes: No visual changes. ENT: No sore throat. Positive dental caries and pain. Cardiovascular: Denies chest pain. Respiratory: Denies shortness of breath. Gastrointestinal: No abdominal pain.  No nausea, no vomiting.  No diarrhea.  No constipation. Genitourinary: Negative for dysuria. Musculoskeletal: Negative for back pain. Skin: Negative for rash. Neurological: Negative for headaches, focal weakness or numbness.  10-point ROS otherwise negative.  ____________________________________________   PHYSICAL EXAM:  VITAL SIGNS: ED Triage Vitals  Enc Vitals Group     BP 10/01/14 1535 138/91 mmHg     Pulse Rate 10/01/14  1535 106     Resp 10/01/14 1535 18     Temp 10/01/14 1535 97.9 F (36.6 C)     Temp Source 10/01/14 1535 Oral     SpO2 10/01/14 1535 96 %     Weight 10/01/14 1535 185 lb (83.915 kg)     Height 10/01/14 1535 5\' 9"  (1.753 m)     Head Cir --      Peak Flow --      Pain Score 10/01/14 1538 8     Pain Loc --      Pain Edu? --      Excl. in GC? --     Constitutional: Alert and oriented. Well appearing and in no acute distress. Mouth/Throat: Mucous membranes are moist.  Oropharynx non-erythematous. Positive dental caries right upper left lower multiple Neck: No stridor.   Cardiovascular: Normal rate, regular rhythm. Grossly normal heart sounds.  Good peripheral circulation. Respiratory: Normal respiratory effort.  No retractions. Lungs CTAB. Neurologic:  Normal speech and language. No gross focal neurologic deficits  are appreciated. No gait instability. Skin:  Skin is warm, dry and intact. No rash noted. Psychiatric: Mood and affect are normal. Speech and behavior are normal.  ____________________________________________   LABS (all labs ordered are listed, but only abnormal results are displayed)  Labs Reviewed - No data to display ____________________________________________   PROCEDURES  Procedure(s) performed: None  Critical Care performed: No  ____________________________________________   INITIAL IMPRESSION / ASSESSMENT AND PLAN / ED COURSE  Pertinent labs & imaging results that were available during my care of the patient were reviewed by me and considered in my medical decision making (see chart for details).  Dental caries/fractures. Rx given for amoxicillin 500 mg 3 times a day #30 Motrin 800 mg 3 times a day and viscous lidocaine as needed for pain.  Patient voices no other emergency medical complaints at this time. He states he will follow-up with the his local dentist as soon as possible ____________________________________________   FINAL CLINICAL IMPRESSION(S) / ED DIAGNOSES  Final diagnoses:  Infected dental carries      Evangeline Dakin, PA-C 10/01/14 1622  Myrna Blazer, MD 10/02/14 [phone]

## 2015-05-01 ENCOUNTER — Emergency Department: Payer: 59

## 2015-05-01 ENCOUNTER — Emergency Department
Admission: EM | Admit: 2015-05-01 | Discharge: 2015-05-01 | Disposition: A | Discharge status: Home / Self Care | Payer: 59 | Attending: Emergency Medicine | Admitting: Emergency Medicine

## 2015-05-01 ENCOUNTER — Encounter: Payer: Self-pay | Admitting: *Deleted

## 2015-05-01 DIAGNOSIS — J189 Pneumonia, unspecified organism: Secondary | ICD-10-CM | POA: Insufficient documentation

## 2015-05-01 DIAGNOSIS — R110 Nausea: Secondary | ICD-10-CM | POA: Diagnosis not present

## 2015-05-01 DIAGNOSIS — H748X3 Other specified disorders of middle ear and mastoid, bilateral: Secondary | ICD-10-CM | POA: Diagnosis not present

## 2015-05-01 DIAGNOSIS — Z79899 Other long term (current) drug therapy: Secondary | ICD-10-CM | POA: Insufficient documentation

## 2015-05-01 DIAGNOSIS — F17200 Nicotine dependence, unspecified, uncomplicated: Secondary | ICD-10-CM | POA: Insufficient documentation

## 2015-05-01 DIAGNOSIS — J181 Lobar pneumonia, unspecified organism: Secondary | ICD-10-CM

## 2015-05-01 DIAGNOSIS — Z792 Long term (current) use of antibiotics: Secondary | ICD-10-CM | POA: Insufficient documentation

## 2015-05-01 DIAGNOSIS — R0981 Nasal congestion: Secondary | ICD-10-CM | POA: Diagnosis present

## 2015-05-01 MED ORDER — AZITHROMYCIN 250 MG PO TABS: ORAL_TABLET | ORAL | Status: AC

## 2015-05-01 MED ORDER — PREDNISONE 20 MG PO TABS
60.0000 mg | ORAL_TABLET | Freq: Once | ORAL | Status: AC
  Administered 2015-05-01: 60 mg via ORAL
  Filled 2015-05-01: qty 3

## 2015-05-01 MED ORDER — PREDNISONE 10 MG PO TABS: 50.0000 mg | ORAL_TABLET | Freq: Every day | ORAL | Status: AC

## 2015-05-01 MED ORDER — GUAIFENESIN ER 600 MG PO TB12: 600.0000 mg | ORAL_TABLET | Freq: Two times a day (BID) | ORAL | Status: AC

## 2015-05-01 MED ORDER — AZITHROMYCIN 500 MG PO TABS
500.0000 mg | ORAL_TABLET | Freq: Once | ORAL | Status: AC
  Administered 2015-05-01: 500 mg via ORAL
  Filled 2015-05-01: qty 1

## 2015-05-01 NOTE — Discharge Instructions (Signed)
Community-Acquired Pneumonia, Adult  Please follow up with the primary care provider of your choice in about 4 weeks for a repeat chest x-ray.  Pneumonia is an infection of the lungs. One type of pneumonia can happen while a person is in a hospital. A different type can happen when a person is not in a hospital (community-acquired pneumonia). It is easy for this kind to spread from person to person. It can spread to you if you breathe near an infected person who coughs or sneezes. Some symptoms include:  A dry cough.  A wet (productive) cough.  Fever.  Sweating.  Chest pain. HOME CARE  Take over-the-counter and prescription medicines only as told by your doctor.  Only take cough medicine if you are losing sleep.  If you were prescribed an antibiotic medicine, take it as told by your doctor. Do not stop taking the antibiotic even if you start to feel better.  Sleep with your head and neck raised (elevated). You can do this by putting a few pillows under your head, or you can sleep in a recliner.  Do not use tobacco products. These include cigarettes, chewing tobacco, and e-cigarettes. If you need help quitting, ask your doctor.  Drink enough water to keep your pee (urine) clear or pale yellow. A shot (vaccine) can help prevent pneumonia. Shots are often suggested for:  People older than 34 years of age.  People older than 34 years of age:  Who are having cancer treatment.  Who have long-term (chronic) lung disease.  Who have problems with their body's defense system (immune system). You may also prevent pneumonia if you take these actions:  Get the flu (influenza) shot every year.  Go to the dentist as often as told.  Wash your hands often. If soap and water are not available, use hand sanitizer. GET HELP IF:  You have a fever.  You lose sleep because your cough medicine does not help. GET HELP RIGHT AWAY IF:  You are short of breath and it gets worse.  You have  more chest pain.  Your sickness gets worse. This is very serious if:  You are an older adult.  Your body's defense system is weak.  You cough up blood.   This information is not intended to replace advice given to you by your health care provider. Make sure you discuss any questions you have with your health care provider.   Document Released: 07/08/2007 Document Revised: 10/10/2014 Document Reviewed: 05/16/2014 Elsevier Interactive Patient Education [employer]2016 Elsevier Inc.

## 2015-05-01 NOTE — ED Provider Notes (Signed)
CSN: [id number]     Arrival date & time 05/01/15  1940 History   First MD Initiated Contact with Patient 05/01/15 2025     Chief Complaint  Patient presents with  . Nasal Congestion  . Cough    HPI   35 year old male presents to the emergency department for evaluation of sinus pressure, cough, cold, and nasal congestion for the past few days. Patient denies shortness of breath or chest pain. Patient states that the cough is frequently productive of yellow sputum. He states that he has facial tenderness but no nasal congestion. He reports that the symptoms started approximately one month ago and are intermittent. He reports a subjective fever for the past 2 days.  Past Medical History  Diagnosis Date  . Arthritis   . Fibromyalgia     Rheumatologist/[place].   History reviewed. No pertinent past surgical history. Family History  Problem Relation Age of Onset  . Cancer Mother     pancreatic cancer  . Diabetes Mother   . Diabetes Father   . Cancer Father     pancreatic cancer   Social History  Substance Use Topics  . Smoking status: Current Every Day Smoker  . Smokeless tobacco: None  . Alcohol Use: No    Review of Systems  Constitutional: Positive for fever, chills and fatigue. Negative for activity change and unexpected weight change.  HENT: Positive for congestion, postnasal drip and sinus pressure. Negative for ear pain, rhinorrhea and sore throat.   Respiratory: Positive for cough. Negative for chest tightness, shortness of breath and wheezing.   Cardiovascular: Negative for chest pain.  Gastrointestinal: Positive for nausea. Negative for vomiting and diarrhea.  Skin: Negative for rash.  Neurological: Negative for headaches.      Allergies  Nsaids  Home Medications   Prior to Admission medications   Medication Sig Start Date End Date Taking? Authorizing Provider  amoxicillin (AMOXIL) 500 MG tablet Take 1 tablet (500 mg total) by mouth 2 (two) times daily.  10/01/14   Charmayne Sheerharles M Beers, PA-C  amphetamine-dextroamphetamine (ADDERALL) 20 MG tablet Take 20 mg by mouth 3 (three) times daily. 01/12/14   Historical Provider, MD  azithromycin (ZITHROMAX) 250 MG tablet 1 tablet for the next 4 days. 05/01/15   Chinita Pesterari B Macari Zalesky, FNP  diazepam (VALIUM) 2 MG tablet Take 2-4 mg by mouth 3 (three) times daily. Take 1 tab in AM, 1 tab in PM, and 2 tabs at night time 12/27/13   Historical Provider, MD  guaiFENesin (MUCINEX) 600 MG 12 hr tablet Take 1 tablet (600 mg total) by mouth 2 (two) times daily. 05/01/15 04/30/16  Chinita Pesterari B Lenix Benoist, FNP  HYDROcodone-acetaminophen (NORCO) 5-325 MG per tablet Take 1-2 tablets by mouth every 4 (four) hours as needed for moderate pain. 10/01/14   Charmayne Sheerharles M Beers, PA-C  lidocaine (XYLOCAINE) 2 % solution Use as directed 20 mLs in the mouth or throat as needed for mouth pain. 10/01/14   Evangeline Dakinharles M Beers, PA-C  Multiple Vitamin (MULTIVITAMIN WITH MINERALS) TABS tablet Take 1 tablet by mouth daily.    Historical Provider, MD  ondansetron (ZOFRAN-ODT) 8 MG disintegrating tablet Take 1 tablet (8 mg total) by mouth every 8 (eight) hours as needed for nausea. 03/22/14   Marisa Severinlga Otter, MD  polyethylene glycol [facility](MIRALAX / Ethelene HalGLYCOLAX) packet Take 17 g by mouth daily as needed for mild constipation.    Historical Provider, MD  predniSONE (DELTASONE) 10 MG tablet Take 5 tablets (50 mg total) by mouth daily. 05/01/15  Daquon Greenleaf B Tomie Spizzirri, FNP   BP 139/96 mmHg  Pulse 111  Temp(Src) 97.9 F (36.6 C) (Oral)  Resp 18  Ht  (1.753 m)  Wt 81.647 kg  BMI 26.57 kg/m2  SpO2 100% Physical Exam  Constitutional: He appears well-developed and well-nourished.  HENT:  Right Ear: Hearing normal. A middle ear effusion is present.  Left Ear: Hearing normal. A middle ear effusion is present.  Eyes: EOM are normal.  Neck: Normal range of motion. Neck supple.  Cardiovascular: Normal rate and regular rhythm.   Pulmonary/Chest: No respiratory distress. He has no wheezes.   Abdominal: He exhibits no distension.  Musculoskeletal: Normal range of motion.  Skin: Skin is warm and dry.  Vitals reviewed.   ED Course  Procedures (including critical care time) Labs Review Labs Reviewed - No data to display  Imaging Review Dg Chest 2 View  05/01/2015  CLINICAL DATA:  Intermittent cough for 4 weeks. EXAM: CHEST  2 VIEW COMPARISON:  None. FINDINGS: Normal heart size. Normal mediastinal contour. No pneumothorax. No pleural effusion. There is a vague right upper parahilar opacity. Otherwise clear lungs. IMPRESSION: Vague right upper parahilar lung opacity, indeterminate. If the patient has a clinical presentation suggestive of a pneumonia, recommend follow-up PA and lateral post treatment chest radiographs in 4 weeks. Otherwise, consider further evaluation with chest CT with IV contrast. Electronically Signed   By: Delbert Phenix M.D.   On: 05/01/2015 21:21   I have personally reviewed and evaluated these images and lab results as part of my medical decision-making.   EKG Interpretation None      MDM   Final diagnoses:  Right middle lobe pneumonia    Patient's clinical symptoms consistent with pneumonia, however he was advised that he will need to have a follow-up chest x-ray in about 4 weeks to make sure that the area that appears to be infection has cleared. He was given a dose of azithromycin tonight and a prescription to be filled tomorrow. He was given a 5 day course of prednisone and Mucinex as well. He is instructed to return to the emergency department for symptoms that change or worsen if he is unable to schedule an appointment with primary care.    Chinita Pester, FNP 05/01/15 1478  Maurilio Lovely, MD 05/01/15 2342

## 2015-05-01 NOTE — ED Notes (Signed)
Pt to ED with sinus pressure, cough, cold congestion for past few days. Pt denies any SOB or chest pain. Vitals stable at this time, NAD noted.

## 2015-05-01 NOTE — ED Notes (Addendum)
Pt c/o generalized fatigue with cough and sinus pressure for a week. States some fevers at home. Pt denies any SOB or CP. Pt has been taking tylenol and ibuprofen at home with no relief.

## 2015-08-22 ENCOUNTER — Encounter (HOSPITAL_COMMUNITY): Payer: Self-pay | Admitting: Emergency Medicine

## 2015-08-22 ENCOUNTER — Emergency Department (HOSPITAL_COMMUNITY)
Admission: EM | Admit: 2015-08-22 | Discharge: 2015-08-22 | Disposition: A | Discharge status: Home / Self Care | Payer: 59 | Attending: Emergency Medicine | Admitting: Emergency Medicine

## 2015-08-22 DIAGNOSIS — K0889 Other specified disorders of teeth and supporting structures: Secondary | ICD-10-CM | POA: Insufficient documentation

## 2015-08-22 DIAGNOSIS — F17200 Nicotine dependence, unspecified, uncomplicated: Secondary | ICD-10-CM | POA: Insufficient documentation

## 2015-08-22 NOTE — ED Notes (Signed)
The patient said he had some teeth pulled several months ago and he is having the exact same pain on the upper right side.  The patient says he has taken ibuprofen and it is not helping.  He rates his pain 9/10.

## 2015-08-22 NOTE — Discharge Instructions (Signed)
Please follow-up with your dentist for further evaluation and management. Please return to the emergency room immediately if any new or worsening signs or symptoms present.

## 2015-08-22 NOTE — ED Provider Notes (Signed)
CSN: [id number]     Arrival date & time 08/22/15  1905 History  By signing my name below, I, Phillis HaggisGabriella Gaje, attest that this documentation has been prepared under the direction and in the presence of Newell RubbermaidJeffrey Emmerich Cryer, PA-C. Electronically Signed: Phillis HaggisGabriella Gaje, ED Scribe. 08/22/2015. 8:53 PM.   Chief Complaint  Patient presents with  . Dental Pain    The patient said he had some teeth pulled several months ago and he is having the exact same pain on the upper right side.  The patient says he has taken ibuprofen and it is not helping.   The history is provided by the patient. No language interpreter was used.  HPI Comments: Jerry Murphy is a 35 y.o. male who presents to the Emergency Department complaining of progressively worsening, throbbing, shooting, upper right dental, gum, and jaw pain onset 13 hours ago. Pt rates his pain 9/10. He reports associated headache that he reports feels "like a migraine." He states that he had teeth pulled in the area several months ago and is now having the same pain in the area. He is unsure of what is causing his pain since he has already had the teeth removed from the area. He believes the area may be infected. He has been taking ibuprofen for his pain at home to no relief. He denies fever, chills, nausea, or vomiting. Pt has a dentist. Pt is allergic to NSAIDs.   Past Medical History  Diagnosis Date  . Arthritis   . Fibromyalgia     Rheumatologist/[place].   History reviewed. No pertinent past surgical history. Family History  Problem Relation Age of Onset  . Cancer Mother     pancreatic cancer  . Diabetes Mother   . Diabetes Father   . Cancer Father     pancreatic cancer   Social History  Substance Use Topics  . Smoking status: Current Every Day Smoker  . Smokeless tobacco: None  . Alcohol Use: No    Review of Systems  All other systems reviewed and are negative.  Allergies  Nsaids  Home Medications   Prior to Admission  medications   Medication Sig Start Date End Date Taking? Authorizing Provider  amoxicillin (AMOXIL) 500 MG tablet Take 1 tablet (500 mg total) by mouth 2 (two) times daily. 10/01/14   Charmayne Sheerharles M Beers, PA-C  amphetamine-dextroamphetamine (ADDERALL) 20 MG tablet Take 20 mg by mouth 3 (three) times daily. 01/12/14   Historical Provider, MD  azithromycin (ZITHROMAX) 250 MG tablet 1 tablet for the next 4 days. 05/01/15   Chinita Pesterari B Triplett, FNP  diazepam (VALIUM) 2 MG tablet Take 2-4 mg by mouth 3 (three) times daily. Take 1 tab in AM, 1 tab in PM, and 2 tabs at night time 12/27/13   Historical Provider, MD  guaiFENesin (MUCINEX) 600 MG 12 hr tablet Take 1 tablet (600 mg total) by mouth 2 (two) times daily. 05/01/15 04/30/16  Chinita Pesterari B Triplett, FNP  HYDROcodone-acetaminophen (NORCO) 5-325 MG per tablet Take 1-2 tablets by mouth every 4 (four) hours as needed for moderate pain. 10/01/14   Charmayne Sheerharles M Beers, PA-C  lidocaine (XYLOCAINE) 2 % solution Use as directed 20 mLs in the mouth or throat as needed for mouth pain. 10/01/14   Evangeline Dakinharles M Beers, PA-C  Multiple Vitamin (MULTIVITAMIN WITH MINERALS) TABS tablet Take 1 tablet by mouth daily.    Historical Provider, MD  ondansetron (ZOFRAN-ODT) 8 MG disintegrating tablet Take 1 tablet (8 mg total) by mouth every 8 (eight)  hours as needed for nausea. 03/22/14   Marisa Severin, MD  polyethylene glycol [facility] / Ethelene Hal) packet Take 17 g by mouth daily as needed for mild constipation.    Historical Provider, MD  predniSONE (DELTASONE) 10 MG tablet Take 5 tablets (50 mg total) by mouth daily. 05/01/15   Cari B Triplett, FNP   BP 136/90 mmHg  Pulse 102  Temp(Src) 98.4 F (36.9 C) (Oral)  Resp 18  Ht  (1.753 m)  Wt 83.915 kg  BMI 27.31 kg/m2  SpO2 98% Physical Exam  Constitutional: He is oriented to person, place, and time. He appears well-developed and well-nourished. No distress.  HENT:  Head: Normocephalic.  Mouth/Throat: Uvula is midline, oropharynx is clear and  moist and mucous membranes are normal. No oropharyngeal exudate, posterior oropharyngeal edema, posterior oropharyngeal erythema or tonsillar abscesses.  External exam shows no asymmetry of the jaw line or face, no signs of obvious swelling, edema, infection. Full active range of motion of the jaw. Neck is supple with full active range of motion, no tenderness to palpation of the soft tissues  Gumline palpated no obvious signs of infection including warmth, redness, abscess, tenderness. Posterior oropharynx clear with no signs of infection, uvula is midline and rises with phonation, tonsils present and normal in size, symmetrical bilateral, tongue is normal soft touch with full active range of motion, floor mouth is soft nontender.  Eyes: Conjunctivae are normal. Pupils are equal, round, and reactive to light. Right eye exhibits no discharge. Left eye exhibits no discharge.  Neck: Normal range of motion. Neck supple. No JVD present. No tracheal deviation present. No thyromegaly present.  Pulmonary/Chest: No stridor.  Lymphadenopathy:    He has no cervical adenopathy.  Neurological: He is alert and oriented to person, place, and time.  Skin: Skin is warm and dry. No rash noted. He is not diaphoretic. No erythema. No pallor.  Psychiatric: He has a normal mood and affect. His behavior is normal. Judgment and thought content normal.  Nursing note and vitals reviewed.   ED Course  Procedures (including critical care time) DIAGNOSTIC STUDIES: Oxygen Saturation is 98% on RA, normal by my interpretation.    COORDINATION OF CARE: 8:52 PM-Discussed treatment plan which includes follow up with dentist with pt at bedside and pt agreed to plan.   Labs Review Labs Reviewed - No data to display  Imaging Review No results found. I have personally reviewed and evaluated these images and lab results as part of my medical decision-making.   EKG Interpretation None      MDM   Final diagnoses:   Pain, dental  Labs:  Imaging:  Consults:  Therapeutics:  Discharge Meds:   Assessment/Plan: Patient with right upper mouth pain. No gross abscess.  Exam unconcerning for Ludwig's angina or spread of infection. Pt advised to alternate ibuprofen and tylenol for pain. Urged patient to follow-up with dentist.   I personally performed the services described in this documentation, which was scribed in my presence. The recorded information has been reviewed and is accurate.    Eyvonne Mechanic, PA-C 08/23/15 1610  Rolan Bucco, MD 08/23/15 1004

## 2015-08-22 NOTE — ED Notes (Signed)
Pt departed in NAD.  

## 2015-10-14 ENCOUNTER — Emergency Department (HOSPITAL_COMMUNITY)
Admission: EM | Admit: 2015-10-14 | Discharge: 2015-10-14 | Disposition: A | Discharge status: Home / Self Care | Payer: 59 | Attending: Emergency Medicine | Admitting: Emergency Medicine

## 2015-10-14 ENCOUNTER — Encounter (HOSPITAL_COMMUNITY): Payer: Self-pay

## 2015-10-14 DIAGNOSIS — J320 Chronic maxillary sinusitis: Secondary | ICD-10-CM

## 2015-10-14 DIAGNOSIS — F17200 Nicotine dependence, unspecified, uncomplicated: Secondary | ICD-10-CM | POA: Insufficient documentation

## 2015-10-14 DIAGNOSIS — R0981 Nasal congestion: Secondary | ICD-10-CM | POA: Diagnosis present

## 2015-10-14 DIAGNOSIS — K0889 Other specified disorders of teeth and supporting structures: Secondary | ICD-10-CM | POA: Diagnosis not present

## 2015-10-14 MED ORDER — FLUTICASONE PROPIONATE 50 MCG/ACT NA SUSP: 2.0000 | Freq: Every day | NASAL | 0 refills | Status: AC

## 2015-10-14 MED ORDER — AMOXICILLIN 500 MG PO CAPS: 500.0000 mg | ORAL_CAPSULE | Freq: Two times a day (BID) | ORAL | 0 refills | Status: AC

## 2015-10-14 NOTE — ED Triage Notes (Signed)
Patient complains of facial pain, left ear pain and congestion x 3 days. NAD

## 2015-10-14 NOTE — ED Provider Notes (Signed)
[facility]-EMERGENCY DEPT Provider Note   CSN: [id number] Arrival date & time: 10/14/15  4098     History   Chief Complaint Chief Complaint  Patient presents with  . Nasal Congestion    HPI Jerry Murphy is a 35 y.o. male.  The history is provided by the patient and medical records. No language interpreter was used.   Jerry Murphy is a 35 y.o. male  with a PMH of fibromyalgia and arthritis who presents to the Emergency Department complaining of persistent worsening sinus congestion and pain x 3 days associated with shooting pain of his left jaw. Also endorses associated left ear pain. Tried Advil with mild relief. Denies cough, shortness of breath, chest pain, visual changes.    Past Medical History:  Diagnosis Date  . Arthritis   . Fibromyalgia    Rheumatologist/[place].    There are no active problems to display for this patient.   History reviewed. No pertinent surgical history.     Home Medications    Prior to Admission medications   Medication Sig Start Date End Date Taking? Authorizing Provider  amoxicillin (AMOXIL) 500 MG capsule Take 1 capsule (500 mg total) by mouth 2 (two) times daily. 10/14/15   Joetta Delprado Pilcher Jacek Colson, PA-C  amphetamine-dextroamphetamine (ADDERALL) 20 MG tablet Take 20 mg by mouth 3 (three) times daily. 01/12/14   Historical Provider, MD  azithromycin (ZITHROMAX) 250 MG tablet 1 tablet for the next 4 days. 05/01/15   Chinita Pester, FNP  diazepam (VALIUM) 2 MG tablet Take 2-4 mg by mouth 3 (three) times daily. Take 1 tab in AM, 1 tab in PM, and 2 tabs at night time 12/27/13   Historical Provider, MD  fluticasone (FLONASE) 50 MCG/ACT nasal spray Place 2 sprays into both nostrils daily. 10/14/15   Allura Doepke Pilcher Kamonte Mcmichen, PA-C  guaiFENesin (MUCINEX) 600 MG 12 hr tablet Take 1 tablet (600 mg total) by mouth 2 (two) times daily. 05/01/15 04/30/16  Chinita Pester, FNP  HYDROcodone-acetaminophen (NORCO) 5-325 MG per tablet Take 1-2 tablets by  mouth every 4 (four) hours as needed for moderate pain. 10/01/14   Charmayne Sheer Beers, PA-C  lidocaine (XYLOCAINE) 2 % solution Use as directed 20 mLs in the mouth or throat as needed for mouth pain. 10/01/14   Evangeline Dakin, PA-C  Multiple Vitamin (MULTIVITAMIN WITH MINERALS) TABS tablet Take 1 tablet by mouth daily.    Historical Provider, MD  ondansetron (ZOFRAN-ODT) 8 MG disintegrating tablet Take 1 tablet (8 mg total) by mouth every 8 (eight) hours as needed for nausea. 03/22/14   Marisa Severin, MD  polyethylene glycol [facility] / Ethelene Hal) packet Take 17 g by mouth daily as needed for mild constipation.    Historical Provider, MD  predniSONE (DELTASONE) 10 MG tablet Take 5 tablets (50 mg total) by mouth daily. 05/01/15   Chinita Pester, FNP    Family History Family History  Problem Relation Age of Onset  . Cancer Mother     pancreatic cancer  . Diabetes Mother   . Diabetes Father   . Cancer Father     pancreatic cancer    Social History Social History  Substance Use Topics  . Smoking status: Current Every Day Smoker  . Smokeless tobacco: Never Used  . Alcohol use No     Allergies   Nsaids   Review of Systems Review of Systems  Constitutional: Negative for fever.  HENT: Positive for congestion, dental problem and ear pain. Negative for trouble swallowing.  Respiratory: Negative for cough and shortness of breath.   Cardiovascular: Negative for chest pain.  Gastrointestinal: Negative for abdominal pain.     Physical Exam Updated Vital Signs BP 145/85 (BP Location: Right Arm)   Pulse 100   Temp 97.9 F (36.6 C) (Oral)   Resp 18   Ht 5\' 9"  (1.753 m)   Wt 85.7 kg   SpO2 97%   BMI 27.91 kg/m   Physical Exam  Constitutional: He is oriented to person, place, and time. He appears well-developed and well-nourished. No distress.  HENT:  Head: Normocephalic and atraumatic.  Right Ear: Tympanic membrane, external ear and ear canal normal.  Left Ear: Tympanic membrane,  external ear and ear canal normal.  OP with no erythema, exudates or tonsillar hypertrophy. + nasal congestion with mucosal edema. TTP of bilateral maxillary sinuses R>L. Pain along upper left back molar. Midline uvula, no trismus, no abscess noted, neck supple and no tenderness. No facial edema.  Neck: Normal range of motion. Neck supple.  No meningeal signs.   Cardiovascular: Normal rate, regular rhythm and normal heart sounds.   Pulmonary/Chest: Effort normal.  Lungs are clear to auscultation bilaterally - no w/r/r  Abdominal: Soft. He exhibits no distension. There is no tenderness.  Musculoskeletal: Normal range of motion.  Lymphadenopathy:    He has no cervical adenopathy.  Neurological: He is alert and oriented to person, place, and time.  Skin: Skin is warm and dry. He is not diaphoretic.  Nursing note and vitals reviewed.    ED Treatments / Results  Labs (all labs ordered are listed, but only abnormal results are displayed) Labs Reviewed - No data to display  EKG  EKG Interpretation None       Radiology No results found.  Procedures Procedures (including critical care time)  Medications Ordered in ED Medications - No data to display   Initial Impression / Assessment and Plan / ED Course  I have reviewed the triage vital signs and the nursing notes.  Pertinent labs & imaging results that were available during my care of the patient were reviewed by me and considered in my medical decision making (see chart for details).  Clinical Course   Valma CavaChristopher T Murphy is a 35 y.o. male who presents to ED for two complaints:   1. Left upper dental pain. No abscess requiring immediate incision and drainage. Patient is afebrile, non toxic appearing, and swallowing secretions well. Exam not concerning for Ludwig's angina or pharyngeal abscess. I stressed the importance of dental follow up for ultimate management of dental pain. Patient voices understanding and is  agreeable to plan.  2. Bilateral sinus pressure/pain x 3 days. Lungs are CTA bilaterally. Patient is a daily smoker.   Will treat with Amoxil. OTC meds for pain. Reasons to return to ED discussed and all questions answered.   Final Clinical Impressions(s) / ED Diagnoses   Final diagnoses:  Dentalgia  Maxillary sinusitis, unspecified chronicity    New Prescriptions New Prescriptions   AMOXICILLIN (AMOXIL) 500 MG CAPSULE    Take 1 capsule (500 mg total) by mouth 2 (two) times daily.   FLUTICASONE (FLONASE) 50 MCG/ACT NASAL SPRAY    Place 2 sprays into both nostrils daily.     Cottonwood Springs LLCJaime Pilcher Lorene Klimas, PA-C 10/14/15 [id number]    Donnetta HutchingBrian Cook, MD 10/15/15 [phone]

## 2015-10-14 NOTE — Discharge Instructions (Signed)
Please take all of your antibiotics until finished!  It is very important that you get evaluated by a dentist as soon as possible. Call today to schedule an appointment.   Eat a soft or liquid diet and rinse your mouth out after meals with warm water. You should see a dentist or return here at once if you have increased swelling, increased pain or uncontrolled bleeding from the site of your injury.  SEEK MEDICAL CARE IF:  You have increased pain not controlled with medicines.  You have swelling around your tooth, in your face or neck.  You have bleeding which starts, continues, or gets worse.  You have a fever >101 If you are unable to open your mouth

## 2016-05-01 ENCOUNTER — Emergency Department (HOSPITAL_COMMUNITY)
Admission: EM | Admit: 2016-05-01 | Discharge: 2016-05-01 | Disposition: A | Discharge status: Home / Self Care | Payer: 59 | Attending: Emergency Medicine | Admitting: Emergency Medicine

## 2016-05-01 ENCOUNTER — Encounter (HOSPITAL_COMMUNITY): Payer: Self-pay | Admitting: *Deleted

## 2016-05-01 DIAGNOSIS — K0889 Other specified disorders of teeth and supporting structures: Secondary | ICD-10-CM

## 2016-05-01 DIAGNOSIS — F17210 Nicotine dependence, cigarettes, uncomplicated: Secondary | ICD-10-CM | POA: Insufficient documentation

## 2016-05-01 MED ORDER — PENICILLIN V POTASSIUM 500 MG PO TABS: 500.0000 mg | ORAL_TABLET | Freq: Four times a day (QID) | ORAL | 0 refills | Status: AC

## 2016-05-01 NOTE — ED Provider Notes (Signed)
[facility]-EMERGENCY DEPT Provider Note   CSN: [id number] Arrival date & time: 05/01/16  1648   By signing my name below, I, [name], attest that this documentation has been prepared under the direction and in the presence of  Tate Zagal- PA-C. Electronically Signed: Clovis Pu, ED Scribe. 05/01/16. 6:44 PM.   History   Chief Complaint Chief Complaint  Patient presents with  . Dental Pain    HPI Comments:  Jerry Murphy is a 36 y.o. male who presents to the Emergency Department complaining of acute onset, gradually worsening, constant, "9/10" left lower dental pain x 2 days. His pain is worse with palpation. Pt notes his tooth has been cracked x 1 month but notes he has not been able to see his dentist. He has taken Advil with mild relief. Pt denies fevers, nausea, vomiting, diarrhea or any other associated symptoms. Pt is followed by a dentist. No other complaints noted.    The history is provided by the patient. No language interpreter was used.    Past Medical History:  Diagnosis Date  . Arthritis   . Fibromyalgia    Rheumatologist/[place].    There are no active problems to display for this patient.   History reviewed. No pertinent surgical history.     Home Medications    Prior to Admission medications   Medication Sig Start Date End Date Taking? Authorizing Provider  amoxicillin (AMOXIL) 500 MG capsule Take 1 capsule (500 mg total) by mouth 2 (two) times daily. 10/14/15   Jaime Pilcher Ward, PA-C  amphetamine-dextroamphetamine (ADDERALL) 20 MG tablet Take 20 mg by mouth 3 (three) times daily. 01/12/14   Historical Provider, MD  azithromycin (ZITHROMAX) 250 MG tablet 1 tablet for the next 4 days. 05/01/15   Chinita Pester, FNP  diazepam (VALIUM) 2 MG tablet Take 2-4 mg by mouth 3 (three) times daily. Take 1 tab in AM, 1 tab in PM, and 2 tabs at night time 12/27/13   Historical Provider, MD  fluticasone (FLONASE) 50 MCG/ACT nasal spray Place 2 sprays  into both nostrils daily. 10/14/15   Chase Picket Ward, PA-C  HYDROcodone-acetaminophen (NORCO) 5-325 MG per tablet Take 1-2 tablets by mouth every 4 (four) hours as needed for moderate pain. 10/01/14   Charmayne Sheer Beers, PA-C  lidocaine (XYLOCAINE) 2 % solution Use as directed 20 mLs in the mouth or throat as needed for mouth pain. 10/01/14   Evangeline Dakin, PA-C  Multiple Vitamin (MULTIVITAMIN WITH MINERALS) TABS tablet Take 1 tablet by mouth daily.    Historical Provider, MD  ondansetron (ZOFRAN-ODT) 8 MG disintegrating tablet Take 1 tablet (8 mg total) by mouth every 8 (eight) hours as needed for nausea. 03/22/14   Marisa Severin, MD  penicillin v potassium (VEETID) 500 MG tablet Take 1 tablet (500 mg total) by mouth 4 (four) times daily. 05/01/16 05/08/16  Kemora Pinard Manuel Mannford, PA  polyethylene glycol (MIRALAX / GLYCOLAX) packet Take 17 g by mouth daily as needed for mild constipation.    Historical Provider, MD  predniSONE (DELTASONE) 10 MG tablet Take 5 tablets (50 mg total) by mouth daily. 05/01/15   Chinita Pester, FNP    Family History Family History  Problem Relation Age of Onset  . Cancer Mother     pancreatic cancer  . Diabetes Mother   . Diabetes Father   . Cancer Father     pancreatic cancer    Social History Social History  Substance Use Topics  . Smoking status:  Current Every Day Smoker    Packs/day: 0.75    Types: Cigarettes  . Smokeless tobacco: Never Used  . Alcohol use No     Allergies   Nsaids   Review of Systems Review of Systems  Constitutional: Positive for chills. Negative for fever.  HENT: Positive for dental problem and facial swelling (mild).   Gastrointestinal: Negative for diarrhea, nausea and vomiting.     Physical Exam Updated Vital Signs BP 130/83 (BP Location: Right Arm)   Pulse (!) 114   Temp 98.5 F (36.9 C)   Resp 20   Ht  (1.753 m)   Wt 81.6 kg   SpO2 98%   BMI 26.58 kg/m   Physical Exam  Constitutional: He appears  well-developed and well-nourished.  Well appearing  HENT:  Head: Normocephalic and atraumatic.  Nose: Nose normal.  Mouth/Throat: Uvula is midline. No trismus in the jaw.    No trismus. No stridor. Patent airway and able to handle secretions well. Tooth number 19 shows evidence that it is broken and is TTP. No evidence of swelling, redness and no TTP to gums. No signs of cellulitis or abscess. Minimal facial swelling noted.    Eyes: Conjunctivae and EOM are normal.  Neck: Normal range of motion.  No swelling or lymphadenopathy noted.   Cardiovascular: Normal rate.   Pulmonary/Chest: Effort normal. No stridor. No respiratory distress.  Normal work of breathing. No respiratory distress noted.   Abdominal: Soft.  Musculoskeletal: Normal range of motion.  Lymphadenopathy:    He has no cervical adenopathy.  Neurological: He is alert.  Skin: Skin is warm.  Psychiatric: He has a normal mood and affect. His behavior is normal.  Nursing note and vitals reviewed.    ED Treatments / Results  DIAGNOSTIC STUDIES:  Oxygen Saturation is 97% on RA, normal by my interpretation.    COORDINATION OF CARE:  6:34 PM Discussed treatment plan with pt at bedside and pt agreed to plan.  Labs (all labs ordered are listed, but only abnormal results are displayed) Labs Reviewed - No data to display  EKG  EKG Interpretation None       Radiology No results found.  Procedures Procedures (including critical care time)  Medications Ordered in ED Medications - No data to display   Initial Impression / Assessment and Plan / ED Course  I have reviewed the triage vital signs and the nursing notes.  Pertinent labs & imaging results that were available during my care of the patient were reviewed by me and considered in my medical decision making (see chart for details).     Patient with dentalgia.  No abscess requiring immediate incision and drainage.  Exam not concerning for Ludwig's angina  or pharyngeal abscess.  Will treat with penicillin. Pt instructed to follow-up with dentist.  Discussed return precautions. Pt safe for discharge.  Final Clinical Impressions(s) / ED Diagnoses   Final diagnoses:  Pain, dental    New Prescriptions Discharge Medication List as of 05/01/2016  6:46 PM    START taking these medications   Details  penicillin v potassium (VEETID) 500 MG tablet Take 1 tablet (500 mg total) by mouth 4 (four) times daily., Starting Fri 05/01/2016, Until Fri 05/08/2016, Print      I personally performed the services described in this documentation, which was scribed in my presence. The recorded information has been reviewed and is accurate.    [address] [place], [place] 05/01/16 1857    Geoffery Lyons, MD  05/01/16 2348  

## 2016-05-01 NOTE — ED Triage Notes (Signed)
Pt in c/o L lower dental pain onset x 2 days with reported bad taste in the mouth, pt has fever & chills, pt reports no pain relief with nsaids, small amt of L sided facial swelling noted, pt has broken teeth, pt tachycardic in triage

## 2016-05-01 NOTE — ED Notes (Signed)
Per [facility] C provider the pt can be seen in POD C & is aware of being afebrile, hypertensive & tachycardic

## 2016-05-01 NOTE — Discharge Instructions (Signed)
Please take penicillin 4 times a day for 7 days. Use warm compress to outer cheek. Also use warm saltwater rinses to help ease pain.   Get help right away if: You are unable to open your mouth. You are having trouble breathing or swallowing. You have a fever. Your face, neck, or jaw is swollen.

## 2016-07-13 IMAGING — CT CT HEAD W/O CM
1 series · 15 of 30 positions shown, 19 images · non-contrast
Comparison: None.

CLINICAL DATA: Acute onset of headache and altered mental status.
Memory loss. Initial encounter.

EXAM:
CT HEAD WITHOUT CONTRAST
TECHNIQUE: Contiguous axial images were obtained from the base of the skull
through the vertex without intravenous contrast.

[Series 2: head 5.0 h30s · axial · 0.45mm/px · z∈[-132,+8]mm · 15 of 32 slices shown, 19 images]
[im 2/32  brain]
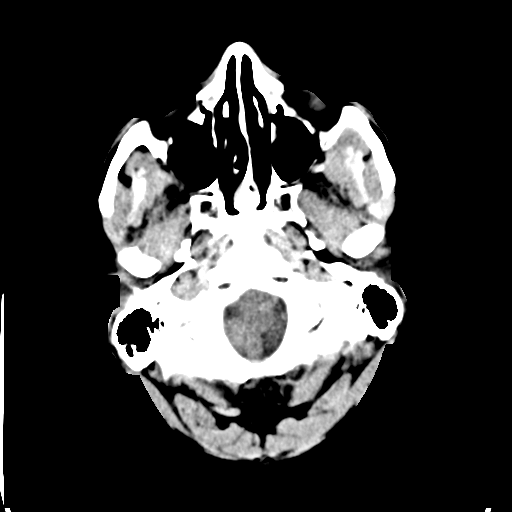
[im 2/32  bone]
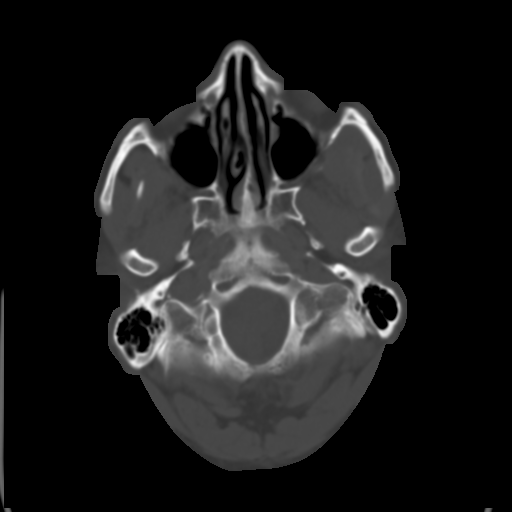
[im 4/32  brain]
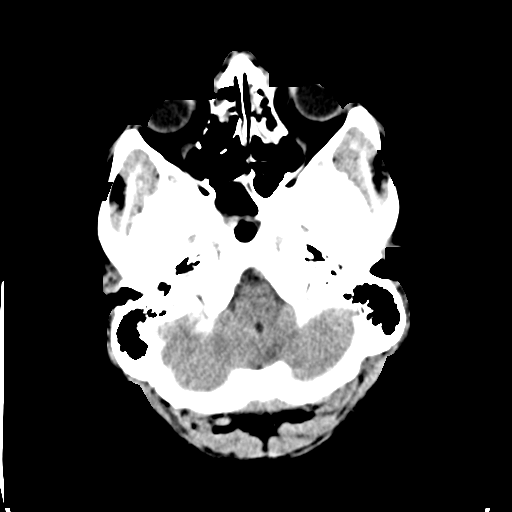
[im 6/32  brain]
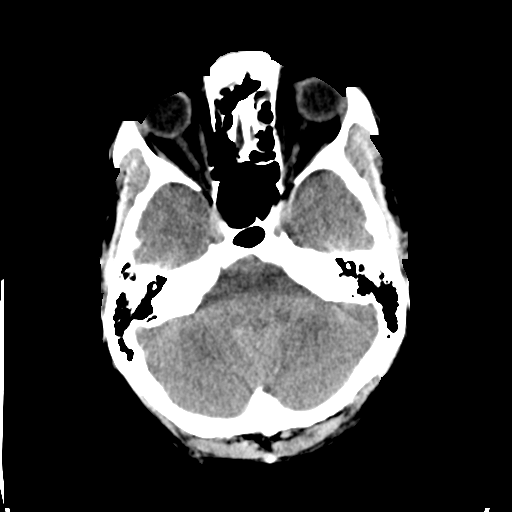
[im 8/32  brain]
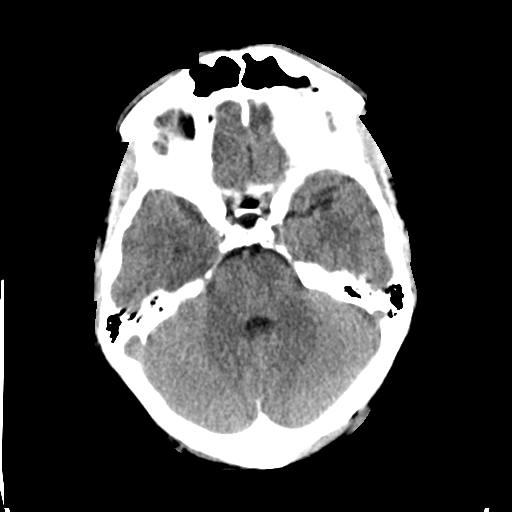
[im 10/32  brain]
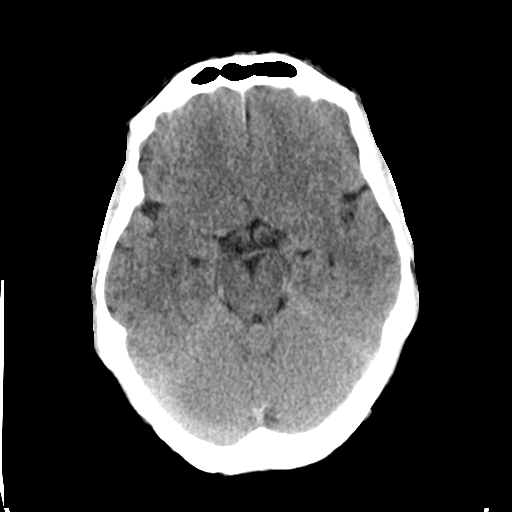
[im 10/32  bone]
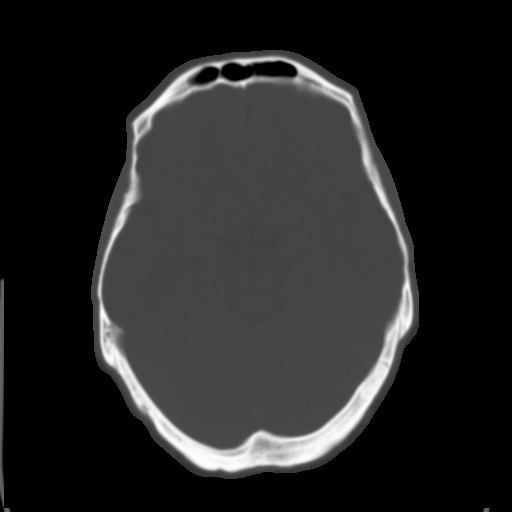
[im 12/32  brain]
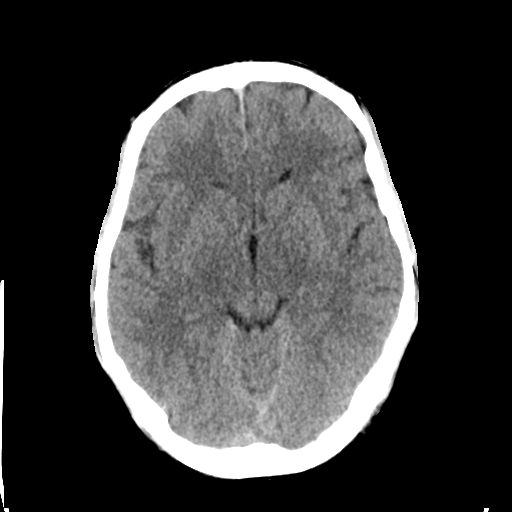
[im 14/32  brain]
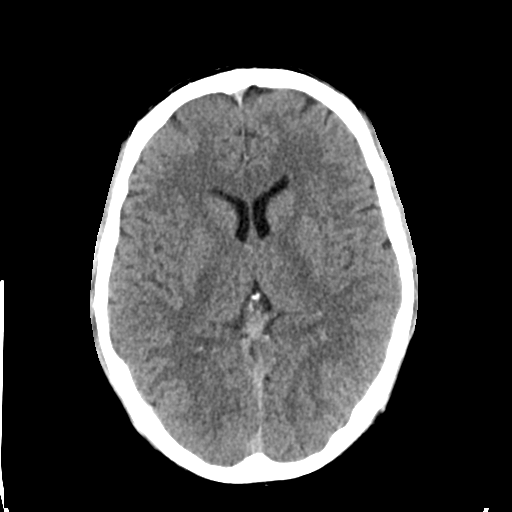
[im 17/32  brain]
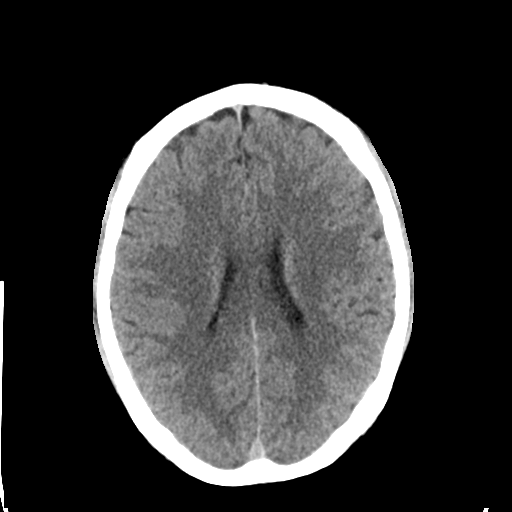
[im 18/32  brain]
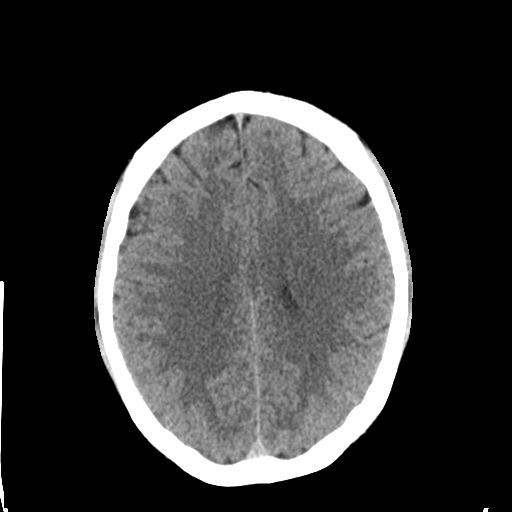
[im 18/32  bone]
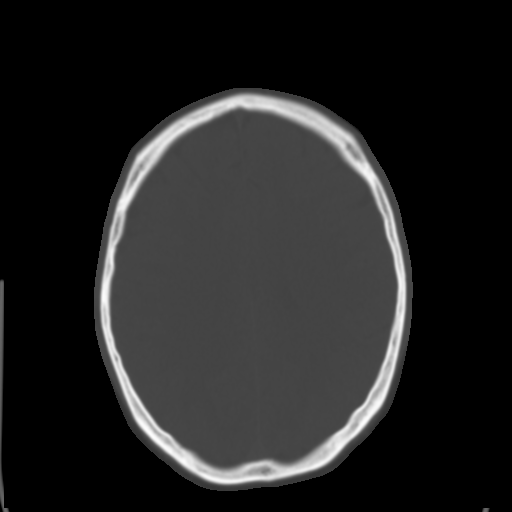
[im 20/32  brain]
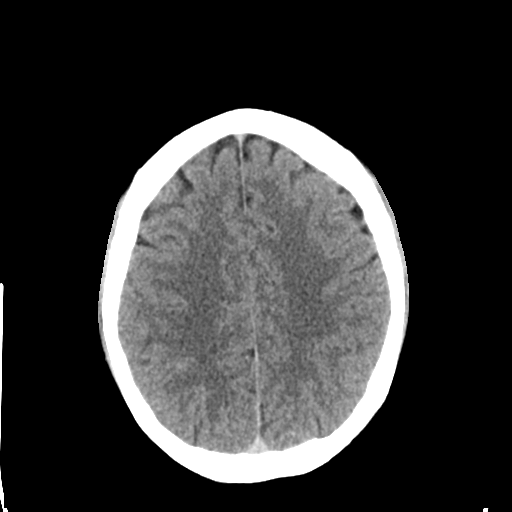
[im 22/32  brain]
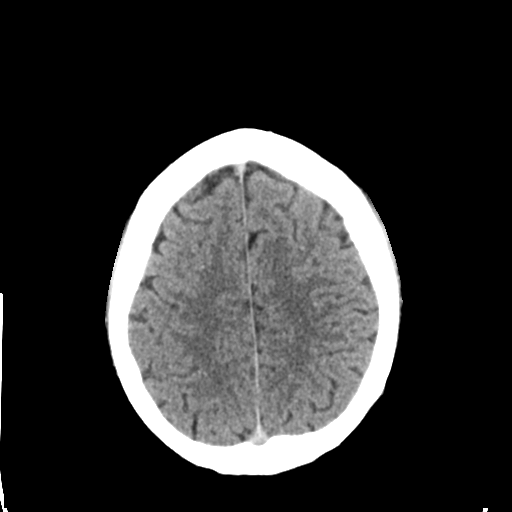
[im 24/32  brain]
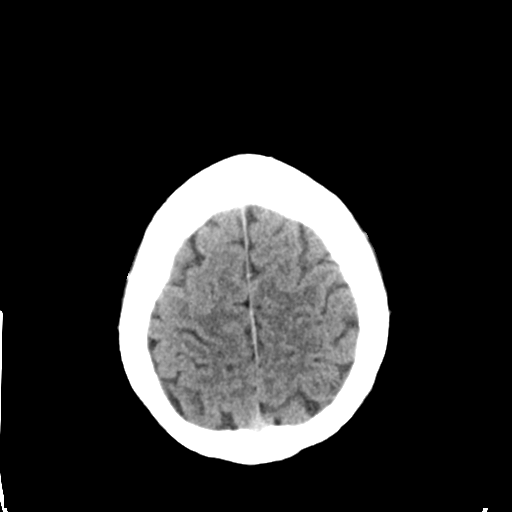
[im 26/32  brain]
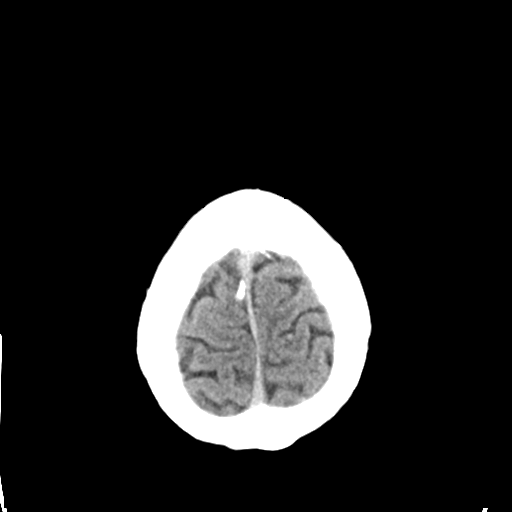
[im 26/32  bone]
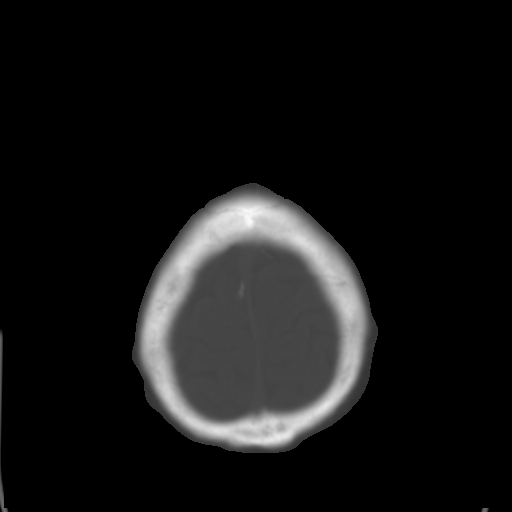
[im 28/32  brain]
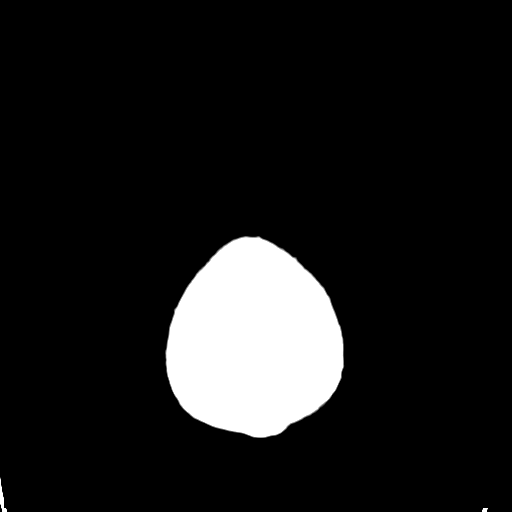
[im 30/32  brain]
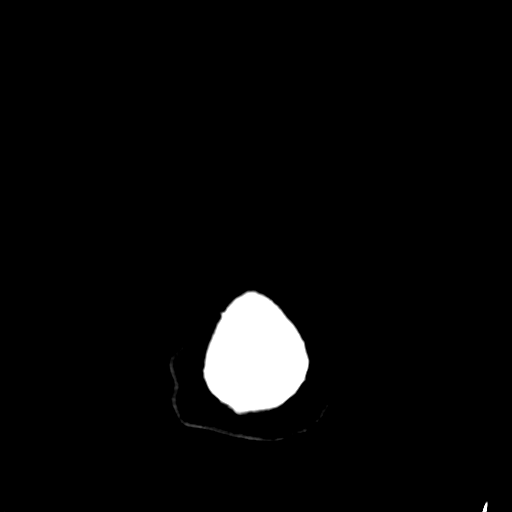

[15 of 30 positions shown; findings below may reference images not displayed]

FINDINGS: There is no evidence of acute infarction, mass lesion, or intra- or
extra-axial hemorrhage on CT.

The posterior fossa, including the cerebellum, brainstem and fourth
ventricle, is within normal limits. The third and lateral
ventricles, and basal ganglia are unremarkable in appearance. The
cerebral hemispheres are symmetric in appearance, with normal
gray-white differentiation. No mass effect or midline shift is seen.

There is no evidence of fracture; visualized osseous structures are
unremarkable in appearance. The orbits are within normal limits.
There is partial opacification of the sphenoid sinus, frontal
sinuses and ethmoid air cells. The remaining paranasal sinuses and
mastoid air cells are well-aerated. No significant soft tissue
abnormalities are seen.
IMPRESSION: 1. No acute intracranial pathology seen on CT.
2. Partial opacification of the sphenoid sinus, frontal sinuses and
ethmoid air cells.

## 2017-06-23 ENCOUNTER — Ambulatory Visit: Payer: Self-pay | Admitting: Nurse Practitioner

## 2017-06-23 VITALS — BP 150/80 | HR 120 | Temp 98.3°F | Resp 16 | Wt 217.0 lb

## 2017-06-23 DIAGNOSIS — J209 Acute bronchitis, unspecified: Secondary | ICD-10-CM

## 2017-06-23 MED ORDER — ALBUTEROL SULFATE HFA 108 (90 BASE) MCG/ACT IN AERS: 2.0000 | INHALATION_SPRAY | Freq: Four times a day (QID) | RESPIRATORY_TRACT | 0 refills | Status: AC | PRN

## 2017-06-23 MED ORDER — MONTELUKAST SODIUM 10 MG PO TABS: 10.0000 mg | ORAL_TABLET | Freq: Every day | ORAL | 1 refills | Status: AC

## 2017-06-23 MED ORDER — BENZONATATE 200 MG PO CAPS: 200.0000 mg | ORAL_CAPSULE | Freq: Three times a day (TID) | ORAL | 0 refills | Status: AC | PRN

## 2017-06-23 MED ORDER — PREDNISONE 10 MG (21) PO TBPK: ORAL_TABLET | ORAL | 0 refills | Status: AC

## 2017-06-23 NOTE — Patient Instructions (Signed)
Acute Bronchitis, Adult Acute bronchitis is sudden (acute) swelling of the air tubes (bronchi) in the lungs. Acute bronchitis causes these tubes to fill with mucus, which can make it hard to breathe. It can also cause coughing or wheezing. In adults, acute bronchitis usually goes away within 2 weeks. A cough caused by bronchitis may last up to 3 weeks. Smoking, allergies, and asthma can make the condition worse. Repeated episodes of bronchitis may cause further lung problems, such as chronic obstructive pulmonary disease (COPD). What are the causes? This condition can be caused by germs and by substances that irritate the lungs, including:  Cold and flu viruses. This condition is most often caused by the same virus that causes a cold.  Bacteria.  Exposure to tobacco smoke, dust, fumes, and air pollution.  What increases the risk? This condition is more likely to develop in people who:  Have close contact with someone with acute bronchitis.  Are exposed to lung irritants, such as tobacco smoke, dust, fumes, and vapors.  Have a weak immune system.  Have a respiratory condition such as asthma.  What are the signs or symptoms? Symptoms of this condition include:  A cough.  Coughing up clear, yellow, or green mucus.  Wheezing.  Chest congestion.  Shortness of breath.  A fever.  Body aches.  Chills.  A sore throat.  How is this diagnosed? This condition is usually diagnosed with a physical exam. During the exam, your health care provider may order tests, such as chest X-rays, to rule out other conditions. He or she may also:  Test a sample of your mucus for bacterial infection.  Check the level of oxygen in your blood. This is done to check for pneumonia.  Do a chest X-ray or lung function testing to rule out pneumonia and other conditions.  Perform blood tests.  Your health care provider will also ask about your symptoms and medical history. How is this  treated? Most cases of acute bronchitis clear up over time without treatment. Your health care provider may recommend:  Drinking more fluids. Drinking more makes your mucus thinner, which may make it easier to breathe.  Taking a medicine for a fever or cough.  Taking an antibiotic medicine.  Using an inhaler to help improve shortness of breath and to control a cough.  Using a cool mist vaporizer or humidifier to make it easier to breathe.  Follow these instructions at home: Medicines  Take over-the-counter and prescription medicines only as told by your health care provider.  If you were prescribed an antibiotic, take it as told by your health care provider. Do not stop taking the antibiotic even if you start to feel better. General instructions  Get plenty of rest.  Drink enough fluids to keep your urine clear or pale yellow.  Avoid smoking and secondhand smoke. Exposure to cigarette smoke or irritating chemicals will make bronchitis worse. If you smoke and you need help quitting, ask your health care provider. Quitting smoking will help your lungs heal faster.  Use an inhaler, cool mist vaporizer, or humidifier as told by your health care provider.  Keep all follow-up visits as told by your health care provider. This is important. How is this prevented? To lower your risk of getting this condition again:  Wash your hands often with soap and water. If soap and water are not available, use hand sanitizer.  Avoid contact with people who have cold symptoms.  Try not to touch your hands to your   mouth, nose, or eyes.  Make sure to get the flu shot every year.  Contact a health care provider if:  Your symptoms do not improve in 2 weeks of treatment. Get help right away if:  You cough up blood.  You have chest pain.  You have severe shortness of breath.  You become dehydrated.  You faint or keep feeling like you are going to faint.  You keep vomiting.  You have a  severe headache.  Your fever or chills gets worse. This information is not intended to replace advice given to you by your health care provider. Make sure you discuss any questions you have with your health care provider. Document Released: 02/27/2004 Document Revised: 08/14/2015 Document Reviewed: 07/10/2015 Elsevier Interactive Patient Education  2018 [employer].  Steps to Quit Smoking Smoking tobacco can be harmful to your health and can affect almost every organ in your body. Smoking puts you, and those around you, at risk for developing many serious chronic diseases. Quitting smoking is difficult, but it is one of the best things that you can do for your health. It is never too late to quit. What are the benefits of quitting smoking? When you quit smoking, you lower your risk of developing serious diseases and conditions, such as:  Lung cancer or lung disease, such as COPD.  Heart disease.  Stroke.  Heart attack.  Infertility.  Osteoporosis and bone fractures.  Additionally, symptoms such as coughing, wheezing, and shortness of breath may get better when you quit. You may also find that you get sick less often because your body is stronger at fighting off colds and infections. If you are pregnant, quitting smoking can help to reduce your chances of having a baby of low birth weight. How do I get ready to quit? When you decide to quit smoking, create a plan to make sure that you are successful. Before you quit:  Pick a date to quit. Set a date within the next two weeks to give you time to prepare.  Write down the reasons why you are quitting. Keep this list in places where you will see it often, such as on your bathroom mirror or in your car or wallet.  Identify the people, places, things, and activities that make you want to smoke (triggers) and avoid them. Make sure to take these actions: ? Throw away all cigarettes at home, at work, and in your car. ? Throw away smoking  accessories, such as Set designer. ? Clean your car and make sure to empty the ashtray. ? Clean your home, including curtains and carpets.  Tell your family, friends, and coworkers that you are quitting. Support from your loved ones can make quitting easier.  Talk with your health care provider about your options for quitting smoking.  Find out what treatment options are covered by your health insurance.  What strategies can I use to quit smoking? Talk with your healthcare provider about different strategies to quit smoking. Some strategies include:  Quitting smoking altogether instead of gradually lessening how much you smoke over a period of time. Research shows that quitting "cold [place]" is more successful than gradually quitting.  Attending in-person counseling to help you build problem-solving skills. You are more likely to have success in quitting if you attend several counseling sessions. Even short sessions of 10 minutes can be effective.  Finding resources and support systems that can help you to quit smoking and remain smoke-free after you quit. These resources are  most helpful when you use them often. They can include: ? Online chats with a Veterinary surgeon. ? Telephone quitlines. ? Automotive engineer. ? Support groups or group counseling. ? Text messaging programs. ? Mobile phone applications.  Taking medicines to help you quit smoking. (If you are pregnant or breastfeeding, talk with your health care provider first.) Some medicines contain nicotine and some do not. Both types of medicines help with cravings, but the medicines that include nicotine help to relieve withdrawal symptoms. Your health care provider may recommend: ? Nicotine patches, gum, or lozenges. ? Nicotine inhalers or sprays. ? Non-nicotine medicine that is taken by mouth.  Talk with your health care provider about combining strategies, such as taking medicines while you are also receiving  in-person counseling. Using these two strategies together makes you more likely to succeed in quitting than if you used either strategy on its own. If you are pregnant or breastfeeding, talk with your health care provider about finding counseling or other support strategies to quit smoking. Do not take medicine to help you quit smoking unless told to do so by your health care provider. What things can I do to make it easier to quit? Quitting smoking might feel overwhelming at first, but there is a lot that you can do to make it easier. Take these important actions:  Reach out to your family and friends and ask that they support and encourage you during this time. Call telephone quitlines, reach out to support groups, or work with a counselor for support.  Ask people who smoke to avoid smoking around you.  Avoid places that trigger you to smoke, such as bars, parties, or smoke-break areas at work.  Spend time around people who do not smoke.  Lessen stress in your life, because stress can be a smoking trigger for some people. To lessen stress, try: ? Exercising regularly. ? Deep-breathing exercises. ? Yoga. ? Meditating. ? Performing a body scan. This involves closing your eyes, scanning your body from head to toe, and noticing which parts of your body are particularly tense. Purposefully relax the muscles in those areas.  Download or purchase mobile phone or tablet apps (applications) that can help you stick to your quit plan by providing reminders, tips, and encouragement. There are many free apps, such as QuitGuide from the [employer] developer for Disease Control and Prevention). You can find other support for quitting smoking (smoking cessation) through smokefree.gov and other websites.  How will I feel when I quit smoking? Within the first 24 hours of quitting smoking, you may start to feel some withdrawal symptoms. These symptoms are usually most noticeable 2-3 days after quitting, but they  usually do not last beyond 2-3 weeks. Changes or symptoms that you might experience include:  Mood swings.  Restlessness, anxiety, or irritation.  Difficulty concentrating.  Dizziness.  Strong cravings for sugary foods in addition to nicotine.  Mild weight gain.  Constipation.  Nausea.  Coughing or a sore throat.  Changes in how your medicines work in your body.  A depressed mood.  Difficulty sleeping (insomnia).  After the first 2-3 weeks of quitting, you may start to notice more positive results, such as:  Improved sense of smell and taste.  Decreased coughing and sore throat.  Slower heart rate.  Lower blood pressure.  Clearer skin.  The ability to breathe more easily.  Fewer sick days.  Quitting smoking is very challenging for most people. Do not get discouraged if you are not successful the  first time. Some people need to make many attempts to quit before they achieve long-term success. Do your best to stick to your quit plan, and talk with your health care provider if you have any questions or concerns. This information is not intended to replace advice given to you by your health care provider. Make sure you discuss any questions you have with your health care provider. Document Released: 01/13/2001 Document Revised: 09/17/2015 Document Reviewed: 06/05/2014 Elsevier Interactive Patient Education  [employer].

## 2017-06-23 NOTE — Progress Notes (Signed)
Subjective:     Jerry Murphy is a 37 y.o. male here for evaluation of a cough. Onset of symptoms was 3 weeks ago. Symptoms have been gradually worsening since that time. The cough is dry and nonproductive and is aggravated by dust. Associated symptoms include: chest pain, shortness of breath and wheezing. Patient does not have a history of asthma. Patient does not have a history of environmental allergens. Patient has not traveled recently. Patient does have a history of smoking. Patient has not had a previous chest x-ray. Patient has not had a PPD done.  Patient states he was seen within the last 2 weeks at another clinic and treated for an upper respiratory infection with antibiotics.  Patient states his upper respiratory symptoms improve with the exception of the cough which brings him in today.  The following portions of the patient's history were reviewed and updated as appropriate: allergies, current medications and past medical history.  Review of Systems Constitutional: positive for chills and fatigue, negative for anorexia, malaise and sweats Eyes: positive for itchy eyes, negative for irritation and visual disturbance Ears, nose, mouth, throat, and face: positive for nasal congestion, negative for ear drainage, earaches, hoarseness and sore throat Respiratory: positive for cough, dyspnea on exertion, pleurisy/chest pain, pneumonia and wheezing, negative for asthma, chronic bronchitis and stridor Cardiovascular: tachycardia Gastrointestinal: negative Neurological: negative Allergic/Immunologic: positive for hay fever    Objective:    BP (!) 150/80 (BP Location: Right Arm, Patient Position: Sitting, Cuff Size: Normal)   Pulse (!) 120   Temp 98.3 F (36.8 C) (Oral)   Resp 16   Wt 217 lb (98.4 kg)   SpO2 98%   BMI 32.05 kg/m  General appearance: alert, cooperative, fatigued and no distress Head: Normocephalic, without obvious abnormality, atraumatic Eyes: negative  findings: lids and lashes normal, corneas clear and pupils equal, round, reactive to light and accomodation, positive findings: conjunctiva: irritated Ears: normal TM's and external ear canals both ears Nose: no discharge, turbinates swollen, inflamed, no sinus tenderness Throat: lips, mucosa, and tongue normal; teeth and gums normal Lungs: clear to auscultation bilaterally Heart: regular rate and rhythm, S1, S2 normal, no murmur, click, rub or gallop Abdomen: soft, non-tender; bowel sounds normal; no masses,  no organomegaly Pulses: 2+ and symmetric Skin: Skin color, texture, turgor normal. No rashes or lesions Lymph nodes: Cervical, supraclavicular, and axillary nodes normal. and cervical and submandibular nodes normal Neurologic: Grossly normal    Patient's BP and HR were elevated.  Patient states he took his Adderall approximately 2 hours before his arrival.    Assessment:    Acute Bronchitis    Plan:    Explained lack of efficacy of antibiotics in viral disease. Antitussives per medication orders. Avoid exposure to tobacco smoke and fumes. B-agonist inhaler. Call if shortness of breath worsens, blood in sputum, change in character of cough, development of fever or chills, inability to maintain nutrition and hydration. Avoid exposure to tobacco smoke and fumes. Discussed with patient's the side effects that may occur with his Adderall and the Prednisone, Albuterol.  Patient instructed to stop Prednisone if needed.  Patient education provided.  Patient verbalizes understanding and has no questions at time of discharge.

## 2017-06-25 ENCOUNTER — Telehealth: Payer: Self-pay

## 2017-09-08 ENCOUNTER — Other Ambulatory Visit (HOSPITAL_BASED_OUTPATIENT_CLINIC_OR_DEPARTMENT_OTHER): Payer: Self-pay

## 2017-09-08 DIAGNOSIS — R0683 Snoring: Secondary | ICD-10-CM

## 2017-09-08 DIAGNOSIS — G4730 Sleep apnea, unspecified: Secondary | ICD-10-CM

## 2017-10-01 ENCOUNTER — Ambulatory Visit (HOSPITAL_BASED_OUTPATIENT_CLINIC_OR_DEPARTMENT_OTHER): Payer: Self-pay | Attending: Otolaryngology

## 2017-10-19 IMAGING — CR DG CHEST 2V
1 series · 2 of 2 positions shown · non-contrast
Comparison: None.

CLINICAL DATA: Intermittent cough for 4 weeks.

EXAM:
CHEST  2 VIEW

[Series 1: dg chest 2 view · 0.14mm/px · 2 of 2 slices shown]
[im 1/2]
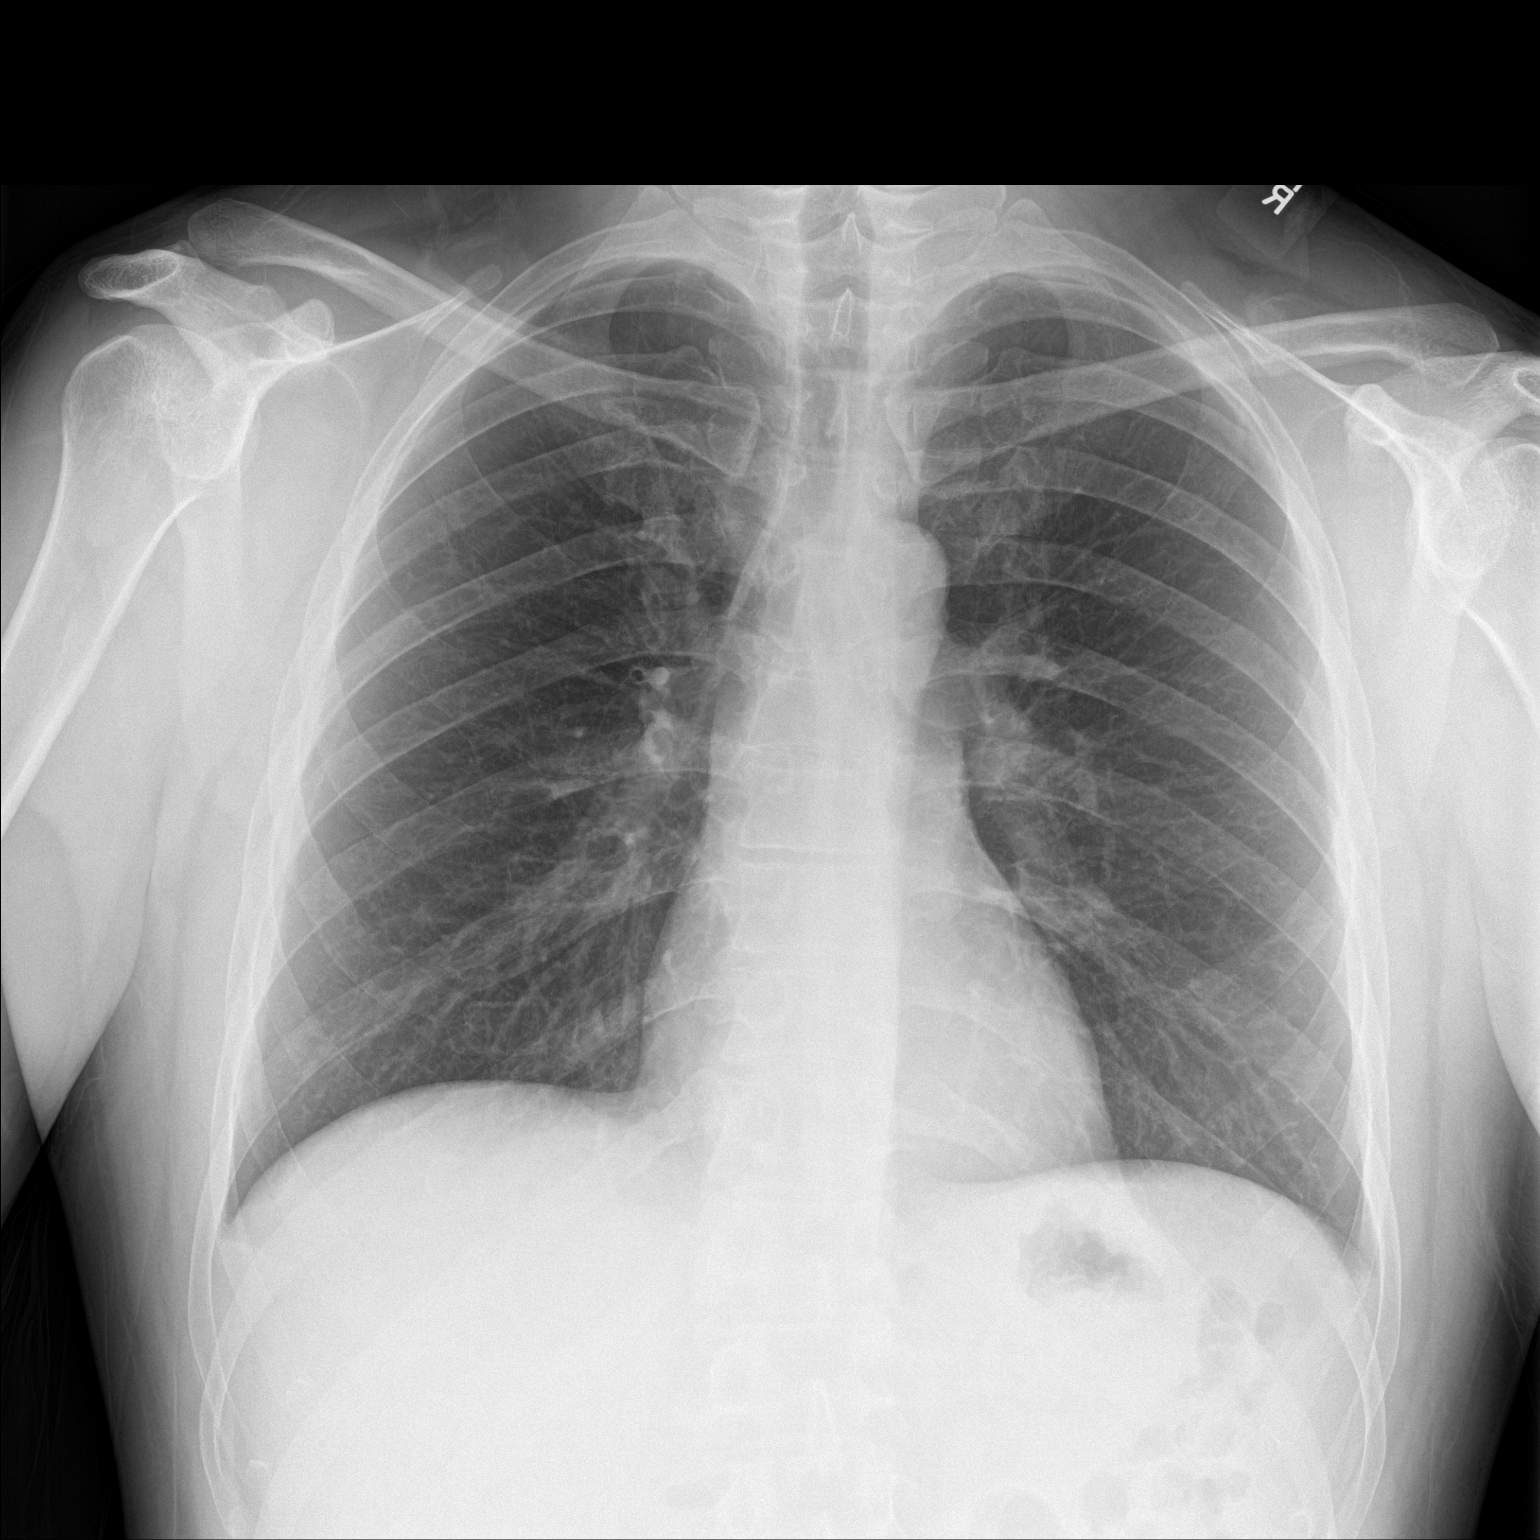
[im 2/2]
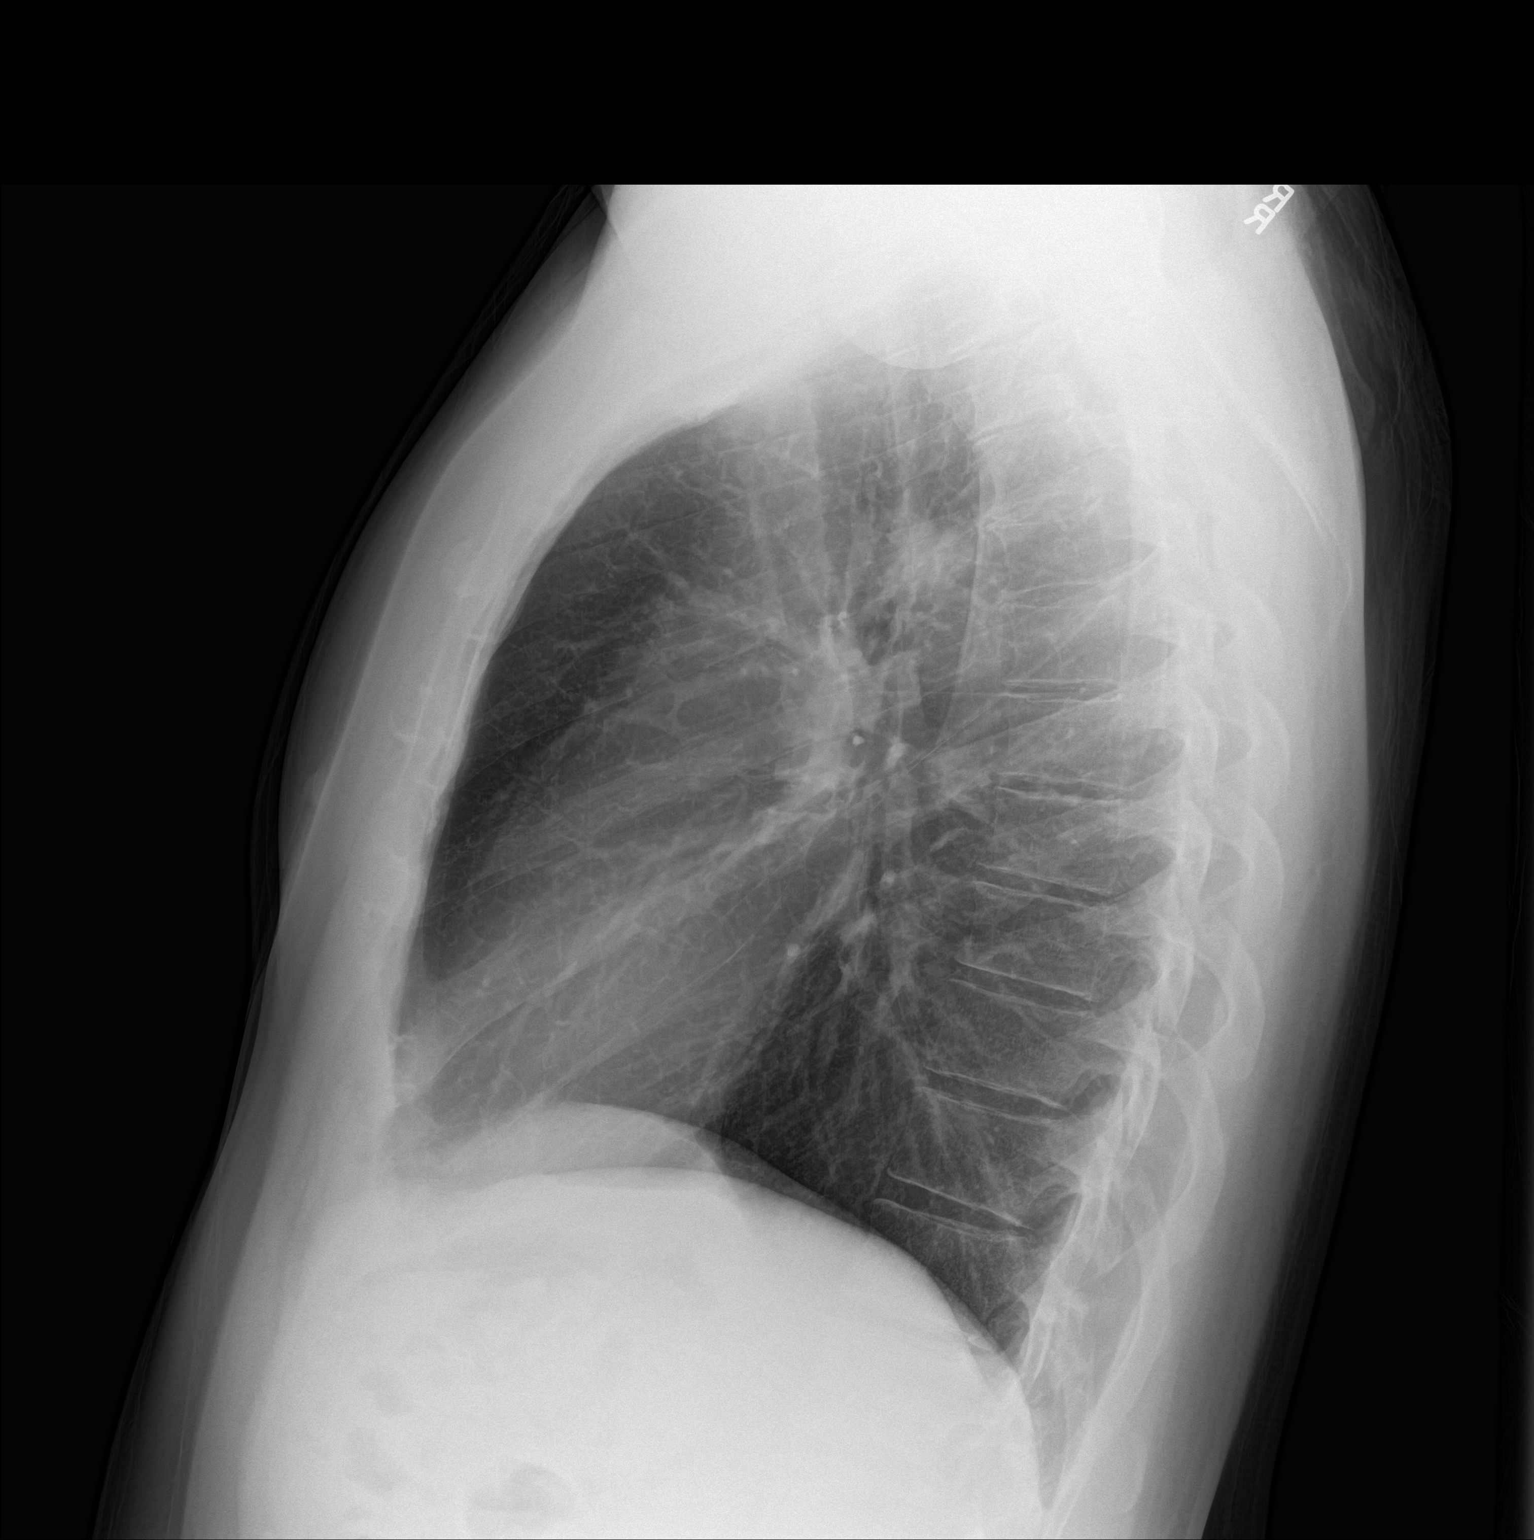

[2 of 2 positions shown; findings below may reference images not displayed]

FINDINGS: Normal heart size. Normal mediastinal contour. No pneumothorax. No
pleural effusion. There is a vague right upper parahilar opacity.
Otherwise clear lungs.
IMPRESSION: Vague right upper parahilar lung opacity, indeterminate. If the
patient has a clinical presentation suggestive of a pneumonia,
recommend follow-up PA and lateral post treatment chest radiographs
in 4 weeks. Otherwise, consider further evaluation with chest CT
with IV contrast.

## 2017-10-27 ENCOUNTER — Ambulatory Visit (HOSPITAL_BASED_OUTPATIENT_CLINIC_OR_DEPARTMENT_OTHER): Payer: Self-pay | Attending: Otolaryngology | Admitting: Internal Medicine

## 2017-10-27 ENCOUNTER — Ambulatory Visit (HOSPITAL_BASED_OUTPATIENT_CLINIC_OR_DEPARTMENT_OTHER): Payer: Self-pay

## 2017-10-27 VITALS — Ht 69.0 in | Wt 208.0 lb

## 2017-10-27 DIAGNOSIS — R0683 Snoring: Secondary | ICD-10-CM | POA: Insufficient documentation

## 2017-10-27 DIAGNOSIS — G4730 Sleep apnea, unspecified: Secondary | ICD-10-CM

## 2017-10-27 DIAGNOSIS — G4733 Obstructive sleep apnea (adult) (pediatric): Secondary | ICD-10-CM | POA: Insufficient documentation

## 2017-11-06 DIAGNOSIS — G4733 Obstructive sleep apnea (adult) (pediatric): Secondary | ICD-10-CM

## 2017-11-06 NOTE — Procedures (Signed)
   Patient Name: Jerry Murphy, Jerry Murphy Date: 10/27/2017 Gender: Male D.O.B: 11-10-1980 Age (years): 37 Referring Provider: Christia Reading Height (inches): 69 Interpreting Physician: Jetty Duhamel MD, ABSM Weight (lbs): 208 RPSGT: Celene Kras BMI: 31 MRN: [id number] Neck Size: 18.50  CLINICAL INFORMATION Sleep Study Type: HST Indication for sleep study: OSA Epworth Sleepiness Score: 12  SLEEP STUDY TECHNIQUE A multi-channel overnight portable sleep study was performed. The channels recorded were: nasal airflow, thoracic respiratory movement, and oxygen saturation with a pulse oximetry. Snoring was also monitored.  MEDICATIONS Patient self administered medications include: none reported.  SLEEP ARCHITECTURE Patient was studied for 371.1 minutes. The sleep efficiency was 98.9 % and the patient was supine for 48.2%. The arousal index was 0.0 per hour.  RESPIRATORY PARAMETERS The overall AHI was 24.3 per hour, with a central apnea index of 0.0 per hour. The oxygen nadir was 81% during sleep.  CARDIAC DATA Mean heart rate during sleep was 84.0 bpm.  IMPRESSIONS - Moderate obstructive sleep apnea occurred during this study (AHI = 24.3/h). - No significant central sleep apnea occurred during this study (CAI = 0.0/h). - Moderate oxygen desaturation was noted during this study (Min O2 = 81%). - Patient snored.  DIAGNOSIS - Obstructive Sleep Apnea (327.23 [G47.33 ICD-10])  RECOMMENDATIONS - Suggest CPAP titration sleep study or DME autopap. Other options would be based on clinical judgment. - Positional therapy avoiding supine position during sleep. - Be careful with alcohol, sedatives and other CNS depressants that may worsen sleep apnea and disrupt normal sleep architecture. - Sleep hygiene should be reviewed to assess factors that may improve sleep quality. - Weight management and regular exercise should be initiated or continued.  [Electronically signed]  11/06/2017 11:07 AM  Jetty Duhamel MD, ABSM Diplomate, American Board of Sleep Medicine   NPI: [id number]                         Jetty Duhamel Diplomate, American Board of Sleep Medicine  ELECTRONICALLY SIGNED ON:  11/06/2017, 11:05 AM [facility] PH: [phone]   FX: [phone] ACCREDITED BY THE AMERICAN [employer]EMY OF SLEEP MEDICINE

## 2019-03-15 NOTE — Telephone Encounter (Signed)
Error
